# Patient Record
Sex: Male | Born: 2000 | Race: White | Hispanic: No | Marital: Single | Smoking: Never smoker
Health system: Southern US, Community
[De-identification: ages and names within clinical notes are randomized; demographics above are authoritative.]

## PROBLEM LIST (undated history)

## (undated) DIAGNOSIS — F840 Autistic disorder: Secondary | ICD-10-CM

## (undated) DIAGNOSIS — F909 Attention-deficit hyperactivity disorder, unspecified type: Secondary | ICD-10-CM

## (undated) DIAGNOSIS — F913 Oppositional defiant disorder: Secondary | ICD-10-CM

## (undated) DIAGNOSIS — F419 Anxiety disorder, unspecified: Secondary | ICD-10-CM

## (undated) HISTORY — DX: Autistic disorder: F84.0

## (undated) HISTORY — DX: Attention-deficit hyperactivity disorder, unspecified type: F90.9

## (undated) HISTORY — DX: Anxiety disorder, unspecified: F41.9

## (undated) HISTORY — PX: TOOTH EXTRACTION: SUR596

---
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
## 2022-01-12 ENCOUNTER — Telehealth (INDEPENDENT_AMBULATORY_CARE_PROVIDER_SITE_OTHER): Payer: Medicaid Other | Admitting: Psychiatry

## 2022-01-12 DIAGNOSIS — F840 Autistic disorder: Secondary | ICD-10-CM

## 2022-01-12 DIAGNOSIS — F902 Attention-deficit hyperactivity disorder, combined type: Secondary | ICD-10-CM | POA: Diagnosis not present

## 2022-01-12 MED ORDER — DIVALPROEX SODIUM ER 250 MG PO TB24
250.0000 mg | ORAL_TABLET | Freq: Every day | ORAL | 1 refills | Status: DC
Start: 2022-01-12 — End: 2022-07-19

## 2022-01-12 MED ORDER — FOCALIN XR 30 MG PO CP24: 30.0000 mg | ORAL_CAPSULE | Freq: Every day | ORAL | 0 refills | Status: DC

## 2022-01-12 MED ORDER — RISPERIDONE 0.5 MG PO TABS: ORAL_TABLET | ORAL | 2 refills | Status: DC

## 2022-01-12 MED ORDER — RISPERIDONE 1 MG PO TABS: ORAL_TABLET | ORAL | 2 refills | Status: DC

## 2022-01-12 NOTE — Progress Notes (Signed)
Virtual Visit via Video Note ? ?I connected with Isaiah Blakes on 01/12/22 at  3:00 PM EST by a video enabled telemedicine application and verified that I am speaking with the correct person using two identifiers. ? ?Location: ?Patient: home ?Provider: office ?  ?I discussed the limitations of evaluation and management by telemedicine and the availability of in person appointments. The patient expressed understanding and agreed to proceed. ? ?History of Present Illness:Met with [name] and mother for med f/u. He has remained on risperidone 42m TID, focalin XR 358mqam, depakote ER 25043mhs, guanfacine ER 4mg63m, and fluoxetine 20mg23m. He continues to do well at home and school and will be graduating from [employer]. He has some anxiety about leaving school but not excessive. His mood is good. He does not endorse depressive sxs. He is sleeping and eating well. ? ?  ?Observations/Objective:[name]; affect pleasant and appropriate. Speech normal rate, volume, rhythm.  Thought process logical and goal-directed.  Mood euthymic.  Thought content positive and congruent with mood.  Attention and concentration good.  ? ? ?Assessment and Plan:Discussed option of beginning some gradual taper of risperidone to determine continued benefit of this med or lowest effective dose and [name] iSuezanne Jacquetgreeable; we will decrease the lunch dose to 0.5mg a59mcontinue 1mg qa67mnd evening. Continue fluoxetine 20mg qa41muanfacine ER 4mg qd,a21mdepakote ER 250mg qhs 21mfocalin XR 30mg qam. 84mMay. ? ?Collaboration of Care: [employer] form for school to administer lower dose of risperidone at noon ? ?Patient/Guardian was advised Release of Information must be obtained prior to any record release in order to collaborate their care with an outside provider. Patient/Guardian was advised if they have not already done so to contact the registration department to sign all necessary forms in order  for us to [place] information regarding their care.  ? ?Consent: Patient/Guardian gives verbal consent for treatment and assignment of benefits for services provided during this visit. Patient/Guardian expressed understanding and agreed to proceed.  ? ? ?Follow Up Instructions: ? ?  ?I discussed the assessment and treatment plan with the patient. The patient was provided an opportunity to ask questions and all were answered. The patient agreed with the plan and demonstrated an understanding of the instructions. ?  ?The patient was advised to call back or seek an in-person evaluation if the symptoms worsen or if the condition fails to improve as anticipated. ? ?I provided 20 minutes of non-face-to-face time during this encounter. ? ? ?Webber Michiels,Raquel James

## 2022-02-05 ENCOUNTER — Other Ambulatory Visit (HOSPITAL_COMMUNITY): Payer: Self-pay | Admitting: Psychiatry

## 2022-02-16 ENCOUNTER — Other Ambulatory Visit (HOSPITAL_COMMUNITY): Payer: Self-pay | Admitting: Psychiatry

## 2022-02-16 ENCOUNTER — Telehealth (HOSPITAL_COMMUNITY): Payer: Self-pay

## 2022-02-16 MED ORDER — FOCALIN XR 30 MG PO CP24: 30.0000 mg | ORAL_CAPSULE | Freq: Every day | ORAL | 0 refills | Status: DC

## 2022-02-16 NOTE — Telephone Encounter (Signed)
sent 

## 2022-02-16 NOTE — Telephone Encounter (Signed)
Patient [facility] [address] [facility] [place] 

## 2022-03-17 ENCOUNTER — Telehealth (HOSPITAL_COMMUNITY): Payer: Self-pay | Admitting: Psychiatry

## 2022-03-17 NOTE — Telephone Encounter (Signed)
Per mom ?Needs refill on focalin  ?[facility] [address] ?

## 2022-03-20 ENCOUNTER — Other Ambulatory Visit (HOSPITAL_COMMUNITY): Payer: Self-pay | Admitting: Psychiatry

## 2022-03-20 MED ORDER — FOCALIN XR 30 MG PO CP24: 30.0000 mg | ORAL_CAPSULE | Freq: Every day | ORAL | 0 refills | Status: DC

## 2022-03-20 NOTE — Telephone Encounter (Signed)
sent 

## 2022-03-27 ENCOUNTER — Other Ambulatory Visit (HOSPITAL_COMMUNITY): Payer: Self-pay | Admitting: Psychiatry

## 2022-03-27 ENCOUNTER — Telehealth (INDEPENDENT_AMBULATORY_CARE_PROVIDER_SITE_OTHER): Payer: Medicaid Other | Admitting: Psychiatry

## 2022-03-27 DIAGNOSIS — F840 Autistic disorder: Secondary | ICD-10-CM | POA: Diagnosis not present

## 2022-03-27 DIAGNOSIS — F902 Attention-deficit hyperactivity disorder, combined type: Secondary | ICD-10-CM

## 2022-03-27 MED ORDER — RISPERIDONE 0.5 MG PO TABS: ORAL_TABLET | ORAL | 2 refills | Status: DC

## 2022-03-27 NOTE — Progress Notes (Signed)
Virtual Visit via Video Note  I connected with Isaiah Blakes on 03/27/22 at  3:30 PM EDT by a video enabled telemedicine application and verified that I am speaking with the correct person using two identifiers.  Location: Patient: home Provider: office   I discussed the limitations of evaluation and management by telemedicine and the availability of in person appointments. The patient expressed understanding and agreed to proceed.  History of Present Illness: Met with [name] and mother for med f/u. He is taking risperidone 43m qam and lunch and 0.530mqhs and has remained on focalin XR 3041mam, guanfacine ER 4mg55m, depakote ER 250mg59m, and fluoxetine 20mg 16m There has been no negative effect from decreasing the evening risperidone. His mood remains good, he is handling situations and changes without becoming upset or overwhelmed. His sleep and appetite are good. He will be doing a summer program to learn Public affairs consultantservations/Objective:[name]. Affect pleasant, full range. Speech normal rate, volume, rhythm.  Thought process logical and goal-directed.  Mood euthymic.  Thought content positive and congruent with mood.  Attention and concentration good.    Assessment and Plan:Continue gradual taper of risperidone, to 0.5mg BI47mnd 1mg at 93mch to determine any continued need or lowest effective dose. Conitnue focalin XR 30mg qam78manfacine ER 4mg qd, f57mxetine 20mg qam a56mepakote ER 250mg qhs. F46muly.   Follow Up Instructions:    I discussed the assessment and treatment plan with the patient. The patient was provided an opportunity to ask questions and all were answered. The patient agreed with the plan and demonstrated an understanding of the instructions.   The patient was advised to call back or seek an in-person evaluation if the symptoms worsen or if the condition fails to improve as anticipated.  I provided 20 minutes of non-face-to-face  time during this encounter.   Chistopher Mangino, Raquel James

## 2022-03-31 ENCOUNTER — Telehealth (HOSPITAL_COMMUNITY): Payer: Self-pay

## 2022-03-31 NOTE — Telephone Encounter (Signed)
Prior Authorization done for Risperidone 0.5mg  Approval# [id number] Expires 03/26/2023 Spoke with Revonda Standard at [facility] notified by fax

## 2022-04-04 NOTE — Telephone Encounter (Signed)
Prior Auth was done and approved by [employer]

## 2022-04-18 ENCOUNTER — Other Ambulatory Visit (HOSPITAL_COMMUNITY): Payer: Self-pay | Admitting: Psychiatry

## 2022-04-18 ENCOUNTER — Telehealth (HOSPITAL_COMMUNITY): Payer: Self-pay

## 2022-04-18 MED ORDER — FOCALIN XR 30 MG PO CP24: 30.0000 mg | ORAL_CAPSULE | Freq: Every day | ORAL | 0 refills | Status: DC

## 2022-04-18 NOTE — Telephone Encounter (Signed)
Patient [facility] a [facility] [address] in [place]

## 2022-04-18 NOTE — Telephone Encounter (Signed)
sent 

## 2022-04-28 ENCOUNTER — Other Ambulatory Visit (HOSPITAL_COMMUNITY): Payer: Self-pay | Admitting: Psychiatry

## 2022-04-29 ENCOUNTER — Other Ambulatory Visit (HOSPITAL_COMMUNITY): Payer: Self-pay | Admitting: Psychiatry

## 2022-05-11 ENCOUNTER — Other Ambulatory Visit (HOSPITAL_COMMUNITY): Payer: Self-pay | Admitting: Psychiatry

## 2022-05-11 ENCOUNTER — Telehealth (HOSPITAL_COMMUNITY): Payer: Self-pay

## 2022-05-11 NOTE — Telephone Encounter (Signed)
Prior Authorization done for Fluoxetine 20mg  tablets Approval# Expires 05/06/23 Spoke with 05/08/23 at [facility] Mom informed Pharmacy informed by fax

## 2022-05-11 NOTE — Telephone Encounter (Signed)
Pt mother called stating that the pharmacy is only willing to fill the capsules of prozac and the patient will not take them. She states that the pharmacy will need to speak with the provider to get the tablets filled. He is going out of town to [place] and will need them for his trip.

## 2022-05-16 ENCOUNTER — Telehealth (HOSPITAL_COMMUNITY): Payer: Medicaid Other | Admitting: Psychiatry

## 2022-05-18 ENCOUNTER — Telehealth (INDEPENDENT_AMBULATORY_CARE_PROVIDER_SITE_OTHER): Payer: Medicaid Other | Admitting: Psychiatry

## 2022-05-18 DIAGNOSIS — F840 Autistic disorder: Secondary | ICD-10-CM | POA: Diagnosis not present

## 2022-05-18 DIAGNOSIS — F902 Attention-deficit hyperactivity disorder, combined type: Secondary | ICD-10-CM

## 2022-05-18 MED ORDER — RISPERIDONE 0.5 MG PO TABS: ORAL_TABLET | ORAL | 1 refills | Status: DC

## 2022-05-18 MED ORDER — FOCALIN XR 30 MG PO CP24
30.0000 mg | ORAL_CAPSULE | Freq: Every day | ORAL | 0 refills | Status: DC
Start: 2022-05-18 — End: 2022-06-19

## 2022-05-18 NOTE — Progress Notes (Signed)
Virtual Visit via Video Note  I connected with Kurt Mclaughlin on 05/18/22 at  3:00 PM EDT by a video enabled telemedicine application and verified that I am speaking with the correct person using two identifiers.  Location: Patient: home Provider: office   I discussed the limitations of evaluation and management by telemedicine and the availability of in person appointments. The patient expressed understanding and agreed to proceed.  History of Present Illness:Met with Kurt Mclaughlin and mother for med f/u. He is taking risperidone 0.25m BID and 167mqlunch as well as focalin XR 3081mam, guanfacine ER 4mg17m, depakote ER 250mg39m, and fluoxetine 20mg 92m He has continued to do well with no negative effects from the very gradual tapering of risperidone. He recently had a trip to [place] and had a good time. There was one incident when he got very anxious and agitated (end of day, waiting for fireworks, street closed off) but otherwise he did fine. 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 Mood is good with no depressive sxs.    Observations/Objective:Neatly/casually dressed and groomed. Affect pleasant, appropriate. Speech normal rate, volume, rhythm.  Thought process logical and goal-directed.  Mood euthymic.  Thought content positive and congruent with mood.  Attention and concentration good.    Assessment and Plan:Continue slow taper of risperidone, to 0.5mg TI31mcontinue other meds as above. F/u Oct.  Collaboration of Care: Other none needed  Patient/Guardian was advised Release of Information must be obtained prior to any record release in order to collaborate their care with an outside provider. Patient/Guardian was advised if they have not already done so to contact the registration department to sign all necessary forms in order for us to  [place] information regarding their care.   Consent: Patient/Guardian gives verbal consent for treatment and assignment of benefits for services provided during this visit. Patient/Guardian expressed understanding and agreed to proceed.   Follow Up Instructions:    I discussed the assessment and treatment plan with the patient. The patient was provided an opportunity to ask questions and all were answered. The patient agreed with the plan and demonstrated an understanding of the instructions.   The patient was advised to call back or seek an in-person evaluation if the symptoms worsen or if the condition fails to improve as anticipated.  I provided 20 minutes of non-face-to-face time during this encounter.   Jarel Cuadra HooRaquel James

## 2022-06-19 ENCOUNTER — Other Ambulatory Visit (HOSPITAL_COMMUNITY): Payer: Self-pay | Admitting: Psychiatry

## 2022-06-19 ENCOUNTER — Telehealth (HOSPITAL_COMMUNITY): Payer: Self-pay

## 2022-06-19 MED ORDER — FOCALIN XR 30 MG PO CP24: 30.0000 mg | ORAL_CAPSULE | Freq: Every day | ORAL | 0 refills | Status: DC

## 2022-06-19 NOTE — Telephone Encounter (Signed)
Patient needs a [facility] [address] [facility] [place] Last refill 05/18/22 Next appt 08/16/22

## 2022-06-19 NOTE — Telephone Encounter (Signed)
sent 

## 2022-07-17 ENCOUNTER — Telehealth (HOSPITAL_COMMUNITY): Payer: Self-pay

## 2022-07-17 ENCOUNTER — Ambulatory Visit: Payer: Self-pay | Admitting: Family Medicine

## 2022-07-17 ENCOUNTER — Other Ambulatory Visit (HOSPITAL_COMMUNITY): Payer: Self-pay | Admitting: Psychiatry

## 2022-07-17 MED ORDER — FOCALIN XR 30 MG PO CP24: 30.0000 mg | ORAL_CAPSULE | Freq: Every day | ORAL | 0 refills | Status: DC

## 2022-07-17 NOTE — Telephone Encounter (Signed)
sent 

## 2022-07-17 NOTE — Telephone Encounter (Signed)
Patient needs a [facility] [address] Last refill 08/14 Next ov 10/11

## 2022-07-19 ENCOUNTER — Other Ambulatory Visit (HOSPITAL_COMMUNITY): Payer: Self-pay | Admitting: Psychiatry

## 2022-08-16 ENCOUNTER — Telehealth (INDEPENDENT_AMBULATORY_CARE_PROVIDER_SITE_OTHER): Payer: Medicaid Other | Admitting: Psychiatry

## 2022-08-16 DIAGNOSIS — F840 Autistic disorder: Secondary | ICD-10-CM | POA: Diagnosis not present

## 2022-08-16 DIAGNOSIS — F902 Attention-deficit hyperactivity disorder, combined type: Secondary | ICD-10-CM

## 2022-08-16 MED ORDER — FOCALIN XR 30 MG PO CP24
30.0000 mg | ORAL_CAPSULE | Freq: Every day | ORAL | 0 refills | Status: DC
Start: 2022-08-16 — End: 2022-09-18

## 2022-08-16 NOTE — Progress Notes (Signed)
Virtual Visit via Video Note  I connected with Ozias Ternes on 08/16/22 at  8:30 AM EDT by a video enabled telemedicine application and verified that I am speaking with the correct person using two identifiers.  Location: Patient: home Provider: office   I discussed the limitations of evaluation and management by telemedicine and the availability of in person appointments. The patient expressed understanding and agreed to proceed.  History of Present Illness:met with [name] and mother for med f/u. He has remained on focalin XR 30mg qam, guanfacine ER 4mg qd, depakote ER 250mg qhs, fluoxetine 20mg qam, and has decreased risperidone to 0.5mg TID with no negative effect. 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 He does not have much structure to his day and mother now working a new job and not home during day so he tends to stay in his room playing video games or watching screen. He has some difficulty maintaining a regular sleep schedule. Appetite is normal. He does not endorse any depressive sxs, no SI or thoughts of self harm. He has some anxiety about going to father's this weekend but this is typical and he does fine during visits.    Observations/Objective:[name]. Affect pleasant, full range. Speech normal rate, volume, rhythm.  Thought process logical and goal-directed.  Mood euthymic.  Thought content positive and congruent with mood.  Attention and concentration good.    Assessment and Plan:Discussed additional decrease in risperidone which [name] does agree to although he is anxious about any med changes. Recommend decreasing risperidone to 0.5mg BID (d/c lunch dose) and continue other meds focalin SR 30mg qam, guanfacine ER 4mg qd, depakote ER 250mg qhs, and fluoxetine 20mg qam with maintained improvement in attention, mood, and emotional  control. Discussed having some time outdoors each day or with some physical activity and staying out of his bed during the day to improve sleep at night. Began discussion of transfer of med management as provider will be leaving. F/u in December. Collaboration of Care: Other discussed transfer of med management  Patient/Guardian was advised Release of Information must be obtained prior to any record release in order to collaborate their care with an outside provider. Patient/Guardian was advised if they have not already done so to contact the registration department to sign all necessary forms in order for us to release information regarding their care.   Consent: Patient/Guardian gives verbal consent for treatment and assignment of benefits for services provided during this visit. Patient/Guardian expressed understanding and agreed to proceed.     Follow Up Instructions:    I discussed the assessment and treatment plan with the patient. The patient was provided an opportunity to ask questions and all were answered. The patient agreed with the plan and demonstrated an understanding of the instructions.   The patient was advised to call back or seek an in-person evaluation if the symptoms worsen or if the condition fails to improve as anticipated.  I provided 30 minutes of non-face-to-face time during this encounter.    , MD   

## 2022-09-18 ENCOUNTER — Other Ambulatory Visit (HOSPITAL_COMMUNITY): Payer: Self-pay | Admitting: Psychiatry

## 2022-09-18 ENCOUNTER — Telehealth (HOSPITAL_COMMUNITY): Payer: Self-pay

## 2022-09-18 MED ORDER — FOCALIN XR 30 MG PO CP24: 30.0000 mg | ORAL_CAPSULE | Freq: Every day | ORAL | 0 refills | Status: DC

## 2022-09-18 NOTE — Telephone Encounter (Signed)
sent 

## 2022-09-18 NOTE — Telephone Encounter (Signed)
Patient [facility] a [facility] [address] [facility] [place] Last refill 10/11 Next ov 12/21

## 2022-10-19 ENCOUNTER — Other Ambulatory Visit (HOSPITAL_COMMUNITY): Payer: Self-pay | Admitting: Psychiatry

## 2022-10-19 MED ORDER — FOCALIN XR 30 MG PO CP24
30.0000 mg | ORAL_CAPSULE | Freq: Every day | ORAL | 0 refills | Status: DC
Start: 2022-10-19 — End: 2022-10-26

## 2022-10-19 NOTE — Telephone Encounter (Signed)
sent 

## 2022-10-19 NOTE — Telephone Encounter (Signed)
Mom LVM last night  & again this AM.Requesting refill for her son ::FOCALIN XR 30 MG CP24   [id number] [place],  - [address]

## 2022-10-26 ENCOUNTER — Telehealth (INDEPENDENT_AMBULATORY_CARE_PROVIDER_SITE_OTHER): Payer: Medicaid Other | Admitting: Psychiatry

## 2022-10-26 DIAGNOSIS — F840 Autistic disorder: Secondary | ICD-10-CM | POA: Diagnosis not present

## 2022-10-26 DIAGNOSIS — F902 Attention-deficit hyperactivity disorder, combined type: Secondary | ICD-10-CM | POA: Diagnosis not present

## 2022-10-26 MED ORDER — FOCALIN XR 25 MG PO CP24: ORAL_CAPSULE | ORAL | 0 refills | Status: DC

## 2022-10-26 MED ORDER — FLUOXETINE HCL 20 MG PO TABS: 20.0000 mg | ORAL_TABLET | Freq: Every day | ORAL | 1 refills | Status: DC

## 2022-10-26 NOTE — Progress Notes (Signed)
Virtual Visit via Video Note  I connected with Kurt Mclaughlin on 10/26/22 at  8:30 AM EST by a video enabled telemedicine application and verified that I am speaking with the correct person using two identifiers.  Location: Patient: home Provider: office   I discussed the limitations of evaluation and management by telemedicine and the availability of in person appointments. The patient expressed understanding and agreed to proceed.  History of Present Illness:met with Kurt Mclaughlin and mother for med f/u. He has remained on focalin XR 7m qam, guanfacine ER 441mqd, depakote ER 25091mhs, fluoxetine 18m25mm, and has decreased risperidone to 0.5mg 51m (no longer takes lunch dose). There has been no negative effect from decrease of risperidone. His mood is good and his emotional control remains stable. His sleep and appetite are good, although he tends to stay up later since he is no longer in school. There is progress being made toward his participation in the virtual program for video game creation; he completed all the necessary paperwork for VR and mother has spoken to the director of the program so hopefully he will start soon (will be online 11am to 6pm).     Observations/Objective:[name]. Affect pleasant and appropriate. Speech normal rate, volume, rhythm.  Thought process logical and goal-directed.  Mood euthymic.  Thought content positive and congruent with mood.  Attention and concentration good.   Assessment and Plan: discussed some decrease in focalin XR; uncertain how much benefit he currently gets from this med, but he has told mother that he feels 'bad" if he doesn't take it at all. Kurt Mclaughlin anxious about making any med changes but agrees to try focalin XR 25mg 81m He will continue other meds: risperidone 0.5mg BI47mfluoxetine 18mg qa16muanfacine ER 4mg qd, 80m depakote ER 250mg qhs.58m Feb. Med management will transfer to Dr. Burgess afNelida Mclaughlin  appt. Collaboration of Care: Other discussed transfer of care  Patient/Guardian was advised Release of Information must be obtained prior to any record release in order to collaborate their care with an outside provider. Patient/Guardian was advised if they have not already done so to contact the registration department to sign all necessary forms in order for us to [place] information regarding their care.   Consent: Patient/Guardian gives verbal consent for treatment and assignment of benefits for services provided during this visit. Patient/Guardian expressed understanding and agreed to proceed.    Follow Up Instructions:    I discussed the assessment and treatment plan with the patient. The patient was provided an opportunity to ask questions and all were answered. The patient agreed with the plan and demonstrated an understanding of the instructions.   The patient was advised to call back or seek an in-person evaluation if the symptoms worsen or if the condition fails to improve as anticipated.  I provided 30 minutes of non-face-to-face time during this encounter.   Kurt Rebel HooverRaquel Mclaughlin

## 2022-11-30 ENCOUNTER — Other Ambulatory Visit (HOSPITAL_COMMUNITY): Payer: Self-pay | Admitting: Psychiatry

## 2022-11-30 ENCOUNTER — Telehealth (HOSPITAL_COMMUNITY): Payer: Self-pay

## 2022-11-30 MED ORDER — FOCALIN XR 25 MG PO CP24
ORAL_CAPSULE | ORAL | 0 refills | Status: DC
Start: 2022-11-30 — End: 2022-12-20

## 2022-11-30 NOTE — Telephone Encounter (Signed)
sent

## 2022-11-30 NOTE — Telephone Encounter (Signed)
Medication refill - Call from patient's Mother requesting a new Focalin XR 25 mg order for [facility] [address], last orderd 10/26/22 and patient returns next on 12/20/22.

## 2022-11-30 NOTE — Telephone Encounter (Signed)
Medication management - Telephone call, with patient's Mother, to inform Dr. Melanee Left had sent in their new Focalin XR [facility] mg [facility] [address] [facility] requested.

## 2022-12-05 ENCOUNTER — Other Ambulatory Visit (HOSPITAL_COMMUNITY): Payer: Self-pay | Admitting: Psychiatry

## 2022-12-20 ENCOUNTER — Telehealth (INDEPENDENT_AMBULATORY_CARE_PROVIDER_SITE_OTHER): Payer: Medicaid Other | Admitting: Psychiatry

## 2022-12-20 DIAGNOSIS — F840 Autistic disorder: Secondary | ICD-10-CM

## 2022-12-20 DIAGNOSIS — F902 Attention-deficit hyperactivity disorder, combined type: Secondary | ICD-10-CM

## 2022-12-20 MED ORDER — FOCALIN XR 25 MG PO CP24: ORAL_CAPSULE | ORAL | 0 refills | Status: DC

## 2022-12-20 NOTE — Progress Notes (Signed)
Virtual Visit via Video Note  I connected with Kurt Mclaughlin on 12/20/22 at 11:00 AM EST by a video enabled telemedicine application and verified that I am speaking with the correct person using two identifiers.  Location: Patient: home Provider: office   I discussed the limitations of evaluation and management by telemedicine and the availability of in person appointments. The patient expressed understanding and agreed to proceed.  History of Present Illness:Met with Kurt Mclaughlin and mother for med f/u. He has been taking slightly lower dose of focalin XR (89m) with no adverse effect and has remained on risperidone 0.571mBID, guanfacine ER 21m25md, depakote ER 250m721ms, and fluoxetine 20mg31m. He does endorse some increased anxiety, likely related to transitions as details for starting the virtual course in game creation are still being worked out with VR and knowing that this is our last appt and he will be changing providers for med management. His mood remains good. He is sleeping well at night and maintaining a schedule of getting up in the morning and staying busy at home.    Observations/Objective:[name]; good eye contact, engaged well. Affect pleasant and appropriate. Speech normal rate, volume, rhythm.  Thought process logical and goal-directed.  Mood euthymic.  Thought content positive and congruent with mood.  Attention and concentration good.   Assessment and Plan:Continue focalin XR 25mg 29m guanfacine ER 21mg qd82mepakote ER 250mg qh85misperidone 0.5mg BID,83md fluoxetine 20mg qam 6m maintained improvement in mood, anxiety, frustration tolerance, and attention. [name] does nSuezanne Jacquetwant to make any further decrease in any of his meds at this time as he will be seeing Dr. Burgess foNelida Mclaughlin med f/u. Some time spent on closure due to longstanding therapeutic relationship.  Collaboration of Care: Other transfer med management  Patient/Guardian was advised Release of  Information must be obtained prior to any record release in order to collaborate their care with an outside provider. Patient/Guardian was advised if they have not already done so to contact the registration department to sign all necessary forms in order for us to [place] information regarding their care.   Consent: Patient/Guardian gives verbal consent for treatment and assignment of benefits for services provided during this visit. Patient/Guardian expressed understanding and agreed to proceed.   Follow Up Instructions:    I discussed the assessment and treatment plan with the patient. The patient was provided an opportunity to ask questions and all were answered. The patient agreed with the plan and demonstrated an understanding of the instructions.   The patient was advised to call back or seek an in-person evaluation if the symptoms worsen or if the condition fails to improve as anticipated.  I provided 20 minutes of non-face-to-face time during this encounter.   Taleisha Kaczynski HooverRaquel James

## 2023-01-29 ENCOUNTER — Other Ambulatory Visit (HOSPITAL_COMMUNITY): Payer: Self-pay | Admitting: Psychiatry

## 2023-01-29 ENCOUNTER — Telehealth (HOSPITAL_COMMUNITY): Payer: Self-pay | Admitting: Psychiatry

## 2023-01-29 MED ORDER — FOCALIN XR 25 MG PO CP24: ORAL_CAPSULE | ORAL | 0 refills | Status: DC

## 2023-01-29 NOTE — Telephone Encounter (Signed)
sent 

## 2023-01-29 NOTE — Telephone Encounter (Signed)
Patient's mother left voicemail requesting refill of FOCALIN XR 25 MG CP24  sufficient to  bridge the gap to appointment on 02/01/2023 with Dr. Nelida Gores.Stated he took his last dose today.   [id number] [address], [place] Phone: [phone]  Fax: [phone]     Last ordered: 12/20/2022 - 30 capsules  Last visit: 12/20/2022  Next visit: 02/01/2023 with Dr. Nelida Gores

## 2023-01-30 NOTE — Telephone Encounter (Signed)
Medication management - Telephone call with patient's Mother to inform Dr. Melanee Left had sent in thier requested new [facility]ocalin XR [facility] [address] and collateral stated they had already picked it up.

## 2023-02-01 ENCOUNTER — Other Ambulatory Visit (HOSPITAL_COMMUNITY): Payer: Self-pay | Admitting: Psychiatry

## 2023-02-01 ENCOUNTER — Encounter (HOSPITAL_COMMUNITY): Payer: Self-pay | Admitting: Psychiatry

## 2023-02-01 ENCOUNTER — Ambulatory Visit (HOSPITAL_BASED_OUTPATIENT_CLINIC_OR_DEPARTMENT_OTHER): Payer: Medicaid Other | Admitting: Psychiatry

## 2023-02-01 DIAGNOSIS — F840 Autistic disorder: Secondary | ICD-10-CM

## 2023-02-01 DIAGNOSIS — F902 Attention-deficit hyperactivity disorder, combined type: Secondary | ICD-10-CM

## 2023-02-01 MED ORDER — FOCALIN XR 25 MG PO CP24: 25.0000 mg | ORAL_CAPSULE | Freq: Every day | ORAL | 0 refills | Status: DC

## 2023-02-01 MED ORDER — RISPERIDONE 0.5 MG PO TABS: 0.5000 mg | ORAL_TABLET | Freq: Two times a day (BID) | ORAL | 1 refills | Status: DC

## 2023-02-01 MED ORDER — FLUOXETINE HCL 20 MG PO TABS: 20.0000 mg | ORAL_TABLET | Freq: Every day | ORAL | 1 refills | Status: DC

## 2023-02-01 MED ORDER — GUANFACINE HCL ER 4 MG PO TB24: 4.0000 mg | ORAL_TABLET | Freq: Every day | ORAL | 1 refills | Status: DC

## 2023-02-01 MED ORDER — HYDROXYZINE HCL 10 MG PO TABS: 10.0000 mg | ORAL_TABLET | Freq: Three times a day (TID) | ORAL | 0 refills | Status: DC | PRN

## 2023-02-01 NOTE — Progress Notes (Signed)
Psychiatric Initial Adult Assessment   Patient Identification: Kurt Mclaughlin MRN:  [id number] Date of Evaluation:  02/01/2023 Referral Source: Dr. Melanee Left Chief Complaint:   Chief Complaint  Patient presents with   Establish Care   Visit Diagnosis:    ICD-10-CM   1. Autism spectrum disorder  F84.0     2. Attention deficit hyperactivity disorder (ADHD), combined type  F90.2        Assessment:  Kurt Mclaughlin is a 22 y.o. male with a history of Autism and ADHD who presents virtually to [place] [facility] for initial evaluation on 02/01/2023, following his transition from Dr. Melanee Left after her retirement.  Patient has a long history of autism and ADHD which has been well managed for the past several years since connecting with Dr. Melanee Left.  Prior to that patient had had an increase in mood lability, anxiety, and mood symptoms in ninth grade.  At initial visit patient reported some increased anxiety related to situational stressors.  Outside of this he notes that mood, anxiety, and attention symptoms have been well-controlled.  We will start the patient on Atarax 10 mg 3 times a day as needed for anxiety and continue on the remainder of his current regimen.  A number of assessments were performed during the evaluation today including PHQ-9 which they scored a 1 on, GAD-7 which they scored a 4 on, and [place] suicide severity screening which showed no risk.    Plan: - Continue Focalin XR 25 mg QD - Continue Risperidone 0.5 mg BID - Continue Guanfacine ER 4 mg QD - Continue Depakote ER 250 mg QHS - Continue Prozac 20 mg QD - Start Atarax 10 mg TID prn anxiety - Will look into therapy options for ADHD and autism - Crisis resources reviewed - Follow up in 6 weeks   History of Present Illness:  Kurt Mclaughlin presents for his appointment today alongside his mother.  He reports that things have been alright since his last appointment with Dr. Melanee Left.  His only concern  is that he might be getting a bit more anxious.  On exploration patient notes that switching providers caused a little bit anxiety but the primary reason for his concern is due to recent interaction with his father.  He describes his anxiety as racing thoughts, increased worry, and some irritability.  Patient notes that he stays with mom most the time but will stay with dad for a weekend every now and then.  In the past it used to be every other week but since Kurt Mclaughlin finished school it has decreased to every few weeks.  His last weekend there, Kurt Mclaughlin and his father had gotten into an argument where his father said that he should have never decreased his medications and should go back up.  Following this Kurt Mclaughlin has started to feel that he might need to go up on his anxiety medications.  Patient feels that the anxiety can start a day or 2 before he goes to dad's gradually building up until he gets there.  After he leaves it might take a day for her to return back to his baseline levels.  When asked what about going to dads triggers anxiety patient notes that his father can get him out of his comfort zone more.  He also describes his father as a bit more stubborn compared to his mother, which can result in been digging his heels in.  We discussed patient's symptoms and the relationship of the anxiety to situational stressors.  Based off the situational nature of this anxiety we suggested starting an as needed medication as opposed to increasing any of his other meds.  We discussed Atarax as a possible option and went over the risk and benefits.  Kurt Mclaughlin was open to giving it a try to see if that helped with his anxiety during the more stressful times.  Outside of this patient wants to remain on the remainder of his current medication regimen and denies any adverse side effects.  He is not currently involved in therapy though had been up until COVID.  They are open to considering therapy in the future.  Associated  Signs/Symptoms: Depression Symptoms:  anhedonia, (Hypo) Manic Symptoms:   Denies Anxiety Symptoms:  Excessive Worry, Psychotic Symptoms:   Denies PTSD Symptoms: NA  Past Psychiatric History:  Had outpatient med management at [facility] to 01-2016, followed by Dr. Darleene Cleaver, and then with Dr. Melanee Left. No prior psychiatric hospitalizations or prior suicide attempts.  Has been on Lexapro, Abilify, Dexedrine, Vyvanse, trazodone, propranolol, and clonidine in the past. Currently on a regimen of Focalin XR, guanfacine, Depakote, Risperdal, and Prozac.  Patient denies any substance use.  Previous Psychotropic Medications: Yes   Substance Abuse History in the last 12 months:  No.  Consequences of Substance Abuse: NA  Past Medical History:  Past Medical History:  Diagnosis Date   ADHD (attention deficit hyperactivity disorder)    Anxiety    Autism    Oppositional defiant disorder     Past Surgical History:  Procedure Laterality Date   CRANIOPLASTY Bilateral 02/05   TOOTH EXTRACTION  Age 67    Family Psychiatric History: Patient's mother has been on Lexapro in the past for anxiety  Family History:  Family History  Problem Relation Age of Onset   ADD / ADHD Neg Hx    Alcohol abuse Neg Hx    Anxiety disorder Neg Hx    Bipolar disorder Neg Hx    Dementia Neg Hx    Depression Neg Hx    Drug abuse Neg Hx    OCD Neg Hx    Paranoid behavior Neg Hx    Physical abuse Neg Hx    Schizophrenia Neg Hx    Seizures Neg Hx    Sexual abuse Neg Hx     Social History:   Social History   Socioeconomic History   Marital status: Single    Spouse name: Not on file   Number of children: Not on file   Years of education: Not on file   Highest education level: Not on file  Occupational History   Not on file  Tobacco Use   Smoking status: Never   Smokeless tobacco: Never  Vaping Use   Vaping Use: Never used  Substance and Sexual Activity   Alcohol use: No   Drug use: No   Sexual  activity: Never  Other Topics Concern   Not on file  Social History Narrative   Not on file   Social Determinants of Health   Financial Resource Strain: Not on file  Food Insecurity: Not on file  Transportation Needs: Not on file  Physical Activity: Not on file  Stress: Not on file  Social Connections: Not on file    Additional Social History: Patient lives with his mother and will spend weekends every few weeks with his dad.  He graduated school in May 2023, prior to which he would spend every other weekend with his dad.  Patient is in the process  of trying to get into a VR program in [place].  Allergies:  No Known Allergies  Metabolic Disorder Labs: Lab Results  Component Value Date   HGBA1C 5.2 12/25/2017   MPG 103 12/25/2017   MPG 120 (H) 04/29/2015   Lab Results  Component Value Date   PROLACTIN 15.3 (H) 12/25/2017   PROLACTIN <0.3 (L) 04/29/2015   Lab Results  Component Value Date   CHOL 186 (H) 12/25/2017   TRIG 117 (H) 12/25/2017   HDL 75 12/25/2017   CHOLHDL 2.5 12/25/2017   VLDL 12 04/29/2015   LDLCALC 90 12/25/2017   LDLCALC 93 04/29/2015   No results found for: "TSH"  Therapeutic Level Labs: No results found for: "LITHIUM" No results found for: "CBMZ" No results found for: "VALPROATE"  Current Medications: Current Outpatient Medications  Medication Sig Dispense Refill   CANNABIDIOL PO Take by mouth.     divalproex (DEPAKOTE ER) 250 MG 24 hr tablet TAKE 1 TABLET BY MOUTH EVERY DAY 90 tablet 1   FLUoxetine (PROZAC) 20 MG tablet Take 1 tablet (20 mg total) by mouth daily. 90 tablet 1   FOCALIN XR 25 MG CP24 Take one each morning after breakfast 30 capsule 0   guanFACINE (INTUNIV) 4 MG TB24 ER tablet TAKE 1 TABLET BY MOUTH EVERY DAY 90 tablet 1   risperiDONE (RISPERDAL) 0.5 MG tablet TAKE 1 TABLET THREE TIMES A DAY 270 tablet 1   No current facility-administered medications for this visit.    Psychiatric Specialty Exam: Review of Systems   There were no vitals taken for this visit.There is no height or weight on file to calculate BMI.  General Appearance: Fairly Groomed  Eye Contact:  Fair  Speech:  Clear and Coherent and Normal Rate  Volume:  Normal  Mood:  Anxious and Euthymic  Affect:  Appropriate  Thought Process:  Coherent  Orientation:  Full (Time, Place, and Person)  Thought Content:  Logical and Abstract Reasoning  Suicidal Thoughts:  No  Homicidal Thoughts:  No  Memory:  NA  Judgement:  Fair  Insight:  Fair  Psychomotor Activity:  Normal  Concentration:  Concentration: Good  Recall:  [place] of Knowledge:Fair  Language: Good  Akathisia:  No    AIMS (if indicated):  not done  Assets:  Museum/gallery curator Physical Health Social Support Vocational/Educational  ADL's:  Intact  Cognition: Impaired,  Mild  Sleep:  Good   Screenings: GAD-7    Flowsheet Row Office Visit from 12/03/2017 in [place] at [facility]  Total GAD-7 Score 9      PHQ2-9    Flowsheet Row Video Visit from 01/12/2021 in [place] at [facility] Visit from 12/03/2017 in [place] at [facility]  PHQ-2 Total Score 3 1  PHQ-9 Total Score 9 --      Flowsheet Row Video Visit from 01/12/2021 in [place] at [facility] No Risk        Collaboration of Care: Medication Management AEB medication prescription and Psychiatrist AEB chart review  Patient/Guardian was advised Release of Information must be obtained prior to any record release in order to collaborate their care with an outside provider. Patient/Guardian was advised if they have not already done so to contact the registration department to sign all necessary forms in order for [place] to release information regarding their care.   Consent:  Patient/Guardian  gives verbal consent for treatment and assignment of benefits for services provided during this visit. Patient/Guardian expressed understanding and agreed to proceed.   Vista Mink, MD 3/28/20242:00 PM    Virtual Visit via Video Note  I connected with Isaiah Blakes on 02/01/23 at  2:00 PM EDT by a video enabled telemedicine application and verified that I am speaking with the correct person using two identifiers.  Location: Patient: Home Provider: Home office   I discussed the limitations of evaluation and management by telemedicine and the availability of in person appointments. The patient expressed understanding and agreed to proceed.   I discussed the assessment and treatment plan with the patient. The patient was provided an opportunity to ask questions and all were answered. The patient agreed with the plan and demonstrated an understanding of the instructions.   The patient was advised to call back or seek an in-person evaluation if the symptoms worsen or if the condition fails to improve as anticipated.  I provided 60 minutes of non-face-to-face time during this encounter.   Vista Mink, MD

## 2023-02-05 NOTE — Addendum Note (Signed)
Addended by: Charlette Caffey on: 02/05/2023 12:39 PM   Modules accepted: Orders

## 2023-02-28 ENCOUNTER — Other Ambulatory Visit (HOSPITAL_COMMUNITY): Payer: Self-pay | Admitting: Psychiatry

## 2023-02-28 DIAGNOSIS — F840 Autistic disorder: Secondary | ICD-10-CM

## 2023-02-28 DIAGNOSIS — F902 Attention-deficit hyperactivity disorder, combined type: Secondary | ICD-10-CM

## 2023-03-15 ENCOUNTER — Encounter (HOSPITAL_COMMUNITY): Payer: Self-pay | Admitting: Psychiatry

## 2023-03-15 ENCOUNTER — Telehealth (HOSPITAL_BASED_OUTPATIENT_CLINIC_OR_DEPARTMENT_OTHER): Payer: Medicaid Other | Admitting: Psychiatry

## 2023-03-15 DIAGNOSIS — F902 Attention-deficit hyperactivity disorder, combined type: Secondary | ICD-10-CM

## 2023-03-15 DIAGNOSIS — F840 Autistic disorder: Secondary | ICD-10-CM | POA: Diagnosis not present

## 2023-03-15 MED ORDER — FOCALIN XR 25 MG PO CP24
25.0000 mg | ORAL_CAPSULE | Freq: Every day | ORAL | 0 refills | Status: DC
Start: 2023-03-15 — End: 2023-05-08

## 2023-03-15 MED ORDER — DEXMETHYLPHENIDATE HCL ER 25 MG PO CP24
25.0000 mg | ORAL_CAPSULE | Freq: Every day | ORAL | 0 refills | Status: DC
Start: 2023-04-20 — End: 2023-05-08

## 2023-03-15 MED ORDER — DIVALPROEX SODIUM ER 250 MG PO TB24: 250.0000 mg | ORAL_TABLET | Freq: Every day | ORAL | 1 refills | Status: DC

## 2023-03-15 NOTE — Progress Notes (Signed)
BH MD/PA/NP OP Progress Note  03/15/2023 11:02 AM Kurt Mclaughlin  MRN:  [id number]  Visit Diagnosis:    ICD-10-CM   1. Autism spectrum disorder  F84.0     2. Attention deficit hyperactivity disorder (ADHD), combined type  F90.2       Assessment: Kurt Mclaughlin is a 22 y.o. male with a history of Autism and ADHD who presents virtually to [facility] at [facility] for initial evaluation on 02/01/2023, following his transition from Dr. Milana Kidney after her retirement.  Patient has a long history of autism and ADHD which has been well managed for the past several years since connecting with Dr. Milana Kidney.  Prior to that patient had had an increase in mood lability, anxiety, and mood symptoms in ninth grade.  At initial visit patient reported some increased anxiety related to situational stressors.  Outside of this he notes that mood, anxiety, and attention symptoms have been well-controlled.    Kurt Mclaughlin presents for follow-up evaluation. Today, 03/15/23, patient reports that her mood and anxiety remains stable.  He has used the Atarax on a couple occasions with good benefit for his anxiety symptoms.  Patient did note a transient increase in appetite after taking the medication however felt that this is manageable.  Patient was provided with some other therapy referral options.  They also have an upcoming appointment with managed care where he might be assigned  peer support mentor.  Plan: - Continue Focalin XR 25 mg QD - Continue Risperidone 0.5 mg BID - Continue Guanfacine ER 4 mg QD - Continue Depakote ER 250 mg QHS - Continue Prozac 20 mg QD - Continue Atarax 10 mg TID prn anxiety - Will look into therapy options for ADHD and autism - Crisis resources reviewed - Follow up in 6 weeks   Chief Complaint:  Chief Complaint  Patient presents with   Follow-up   HPI: Kurt Mclaughlin presents alongside his mother.  He reports that the last 6 weeks have gone alright with no  significant concerns.  Patient feels like his mood has been fairly stable and anxiety has been well-controlled.  He notes that he has not gone to his dad's much in the interim due to various things coming up.  Kurt Mclaughlin did use the Atarax a couple times in terms and found that it had been helpful for his anxiety.  He did mention that it made him hungrier initially before that wore off.  We did review potential side effects of this medication.  Patient feels like the hunger side effects are manageable.  Patient denies any other concerns at this time.  He was updated on the power not taking their insurance and provided with other therapy options.  Patient's mother also noted that they are meeting with the manage care for next week to see if they will be assigned individual available, spending time with family during the days.  She mentioned that they also might go to the autism center to play some games.  Past Psychiatric History: Had outpatient med management at [facility] to 01-2016, followed by Dr. Jannifer Franklin, and then with Dr. Milana Kidney. No prior psychiatric hospitalizations or prior suicide attempts.  Has been on Lexapro, Abilify, Dexedrine, Vyvanse, trazodone, propranolol, and clonidine in the past. Currently on a regimen of Focalin XR, guanfacine, Depakote, Risperdal, and Prozac.  Past Medical History:  Past Medical History:  Diagnosis Date   ADHD (attention deficit hyperactivity disorder)    Anxiety    Autism    Oppositional defiant  disorder     Past Surgical History:  Procedure Laterality Date   CRANIOPLASTY Bilateral 02/05   TOOTH EXTRACTION  Age 61   Family History:  Family History  Problem Relation Age of Onset   ADD / ADHD Neg Hx    Alcohol abuse Neg Hx    Anxiety disorder Neg Hx    Bipolar disorder Neg Hx    Dementia Neg Hx    Depression Neg Hx    Drug abuse Neg Hx    OCD Neg Hx    Paranoid behavior Neg Hx    Physical abuse Neg Hx    Schizophrenia Neg Hx    Seizures Neg Hx     Sexual abuse Neg Hx     Social History:  Social History   Socioeconomic History   Marital status: Single    Spouse name: Not on file   Number of children: Not on file   Years of education: Not on file   Highest education level: Not on file  Occupational History   Not on file  Tobacco Use   Smoking status: Never   Smokeless tobacco: Never  Vaping Use   Vaping Use: Never used  Substance and Sexual Activity   Alcohol use: No   Drug use: No   Sexual activity: Never  Other Topics Concern   Not on file  Social History Narrative   Not on file   Social Determinants of Health   Financial Resource Strain: Not on file  Food Insecurity: Not on file  Transportation Needs: Not on file  Physical Activity: Not on file  Stress: Not on file  Social Connections: Not on file    Allergies: No Known Allergies  Current Medications: Current Outpatient Medications  Medication Sig Dispense Refill   CANNABIDIOL PO Take by mouth.     divalproex (DEPAKOTE ER) 250 MG 24 hr tablet TAKE 1 TABLET BY MOUTH EVERY DAY 90 tablet 1   FLUoxetine (PROZAC) 20 MG tablet Take 1 tablet (20 mg total) by mouth daily. 90 tablet 1   FOCALIN XR 25 MG CP24 Take 1 capsule (25 mg total) by mouth daily. Take one each morning after breakfast 30 capsule 0   guanFACINE (INTUNIV) 4 MG TB24 ER tablet Take 1 tablet (4 mg total) by mouth daily. 90 tablet 1   hydrOXYzine (ATARAX) 10 MG tablet Take 1 tablet (10 mg total) by mouth 3 (three) times daily as needed. 90 tablet 0   risperiDONE (RISPERDAL) 0.5 MG tablet Take 1 tablet (0.5 mg total) by mouth 2 (two) times daily. 180 tablet 1   No current facility-administered medications for this visit.     Psychiatric Specialty Exam: Review of Systems  There were no vitals taken for this visit.There is no height or weight on file to calculate BMI.  General Appearance: Fairly Groomed  Eye Contact:  Minimal  Speech:  Clear and Coherent and Normal Rate  Volume:  Normal  Mood:   Euthymic  Affect:  Congruent  Thought Process:  Coherent  Orientation:  Full (Time, Place, and Person)  Thought Content: Logical and concrete    Suicidal Thoughts:  No  Homicidal Thoughts:  No  Memory:  Immediate;   Fair  Judgement:  Fair  Insight:  Fair  Psychomotor Activity:  Normal  Concentration:  Concentration: Fair  Recall:  [employer] of Knowledge: Fair  Language: Good  Akathisia:  No    AIMS (if indicated): not done  Assets:  Architect  Leisure Time Physical Health  ADL's:  Intact  Cognition: WNL  Sleep:  Good   Metabolic Disorder Labs: Lab Results  Component Value Date   HGBA1C 5.2 12/25/2017   MPG 103 12/25/2017   MPG 120 (H) 04/29/2015   Lab Results  Component Value Date   PROLACTIN 15.3 (H) 12/25/2017   PROLACTIN <0.3 (L) 04/29/2015   Lab Results  Component Value Date   CHOL 186 (H) 12/25/2017   TRIG 117 (H) 12/25/2017   HDL 75 12/25/2017   CHOLHDL 2.5 12/25/2017   VLDL 12 04/29/2015   LDLCALC 90 12/25/2017   LDLCALC 93 04/29/2015   No results found for: "TSH"  Therapeutic Level Labs: No results found for: "LITHIUM" No results found for: "VALPROATE" No results found for: "CBMZ"   Screenings: GAD-7    Flowsheet Row [facility] Office Visit from 12/03/2017 in [facility] at [facility]  Total GAD-7 Score 4 9      PHQ2-9    Flowsheet Row Office Visit from 02/01/2023 in [facility] Video Visit from 01/12/2021 in [facility] at [facility] Office Visit from 12/03/2017 in [facility] at [facility]  PHQ-2 Total Score 1 3 1   PHQ-9 Total Score -- 9 --      Flowsheet Row Office Visit from 02/01/2023 in [facility] Video Visit from 01/12/2021 in  [facility] at [facility]  C-SSRS RISK CATEGORY No Risk No Risk       Collaboration of Care: Collaboration of Care: Medication Management AEB medication prescription  Patient/Guardian was advised Release of Information must be obtained prior to any record release in order to collaborate their care with an outside provider. Patient/Guardian was advised if they have not already done so to contact the registration department to sign all necessary forms in order for [place] to release information regarding their care.   Consent: Patient/Guardian gives verbal consent for treatment and assignment of benefits for services provided during this visit. Patient/Guardian expressed understanding and agreed to proceed.    Stasia Cavalier, MD 03/15/2023, 11:02 AM   Virtual Visit via Video Note  I connected with Kurt Mclaughlin on 03/15/23 at  2:00 PM EDT by a video enabled telemedicine application and verified that I am speaking with the correct person using two identifiers.  Location: Patient: Home Provider: Home Office   I discussed the limitations of evaluation and management by telemedicine and the availability of in person appointments. The patient expressed understanding and agreed to proceed.   I discussed the assessment and treatment plan with the patient. The patient was provided an opportunity to ask questions and all were answered. The patient agreed with the plan and demonstrated an understanding of the instructions.   The patient was advised to call back or seek an in-person evaluation if the symptoms worsen or if the condition fails to improve as anticipated.  I provided 15 minutes of non-face-to-face time during this encounter.   Stasia Cavalier, MD

## 2023-04-24 ENCOUNTER — Other Ambulatory Visit (HOSPITAL_COMMUNITY): Payer: Self-pay

## 2023-04-24 DIAGNOSIS — F840 Autistic disorder: Secondary | ICD-10-CM

## 2023-04-24 MED ORDER — FLUOXETINE HCL 20 MG PO TABS
20.0000 mg | ORAL_TABLET | Freq: Every day | ORAL | 0 refills | Status: DC
Start: 2023-04-24 — End: 2023-07-10

## 2023-04-24 NOTE — Telephone Encounter (Signed)
Patients mother called, she picked up a refill on the Prozac and it was capsules. Patient will only take tablets. I called the pharmacy and they said that the tablets are not approved just capsule. I called Medicaid and got the tablets approved.  Approval # [id number] Interaction ID : [id number] s/w Deja  I called the pharmacy back and mom is going to return the bottle she just picked up and get the tablets in return. I had to resend the prescription for the pharmacist to be able to fill.

## 2023-05-08 ENCOUNTER — Encounter (HOSPITAL_COMMUNITY): Payer: Self-pay | Admitting: Psychiatry

## 2023-05-08 ENCOUNTER — Telehealth (HOSPITAL_BASED_OUTPATIENT_CLINIC_OR_DEPARTMENT_OTHER): Payer: MEDICAID | Admitting: Psychiatry

## 2023-05-08 DIAGNOSIS — F840 Autistic disorder: Secondary | ICD-10-CM

## 2023-05-08 DIAGNOSIS — F902 Attention-deficit hyperactivity disorder, combined type: Secondary | ICD-10-CM

## 2023-05-08 MED ORDER — FOCALIN XR 25 MG PO CP24
25.0000 mg | ORAL_CAPSULE | Freq: Every day | ORAL | 0 refills | Status: DC
Start: 2023-05-21 — End: 2023-07-10

## 2023-05-08 MED ORDER — DEXMETHYLPHENIDATE HCL ER 25 MG PO CP24: 25.0000 mg | ORAL_CAPSULE | Freq: Every day | ORAL | 0 refills | Status: DC

## 2023-05-08 NOTE — Progress Notes (Signed)
BH MD/PA/NP OP Progress Note  05/08/2023 3:52 PM Kurt Mclaughlin  MRN:  [id number]  Visit Diagnosis:    ICD-10-CM   1. Autism spectrum disorder  F84.0     2. Attention deficit hyperactivity disorder (ADHD), combined type  F90.2       Assessment: Kurt Mclaughlin is a 22 y.o. male with a history of Autism and ADHD who presents virtually to [facility] at [facility] for initial evaluation on 02/01/2023, following his transition from Kurt Mclaughlin after her retirement.  Patient has a long history of autism and ADHD which has been well managed for the past several years since connecting with Kurt Mclaughlin.  Prior to that patient had had an increase in mood lability, anxiety, and mood symptoms in ninth grade.  At initial visit patient reported some increased anxiety related to situational stressors.  Outside of this he notes that mood, anxiety, and attention symptoms have been well-controlled.    Kurt Mclaughlin presents for follow-up evaluation. Today, 05/08/23, patient reports that things have remained stable in regards to his mood and anxiety over the past 6 weeks.  He has taken his medication consistently and denies any notable adverse side effects.  Patient cannot remember whether he has tried the Atarax again in the interim.  Of note patient was approved for 28 hours of individual manage care support and will be meeting with someone later this week.  From there they will be able to hire an individual support person.  We will continue on his current regimen and follow-up in 2 months.  Plan: - Continue Focalin XR 25 mg QD - Continue Risperidone 0.5 mg BID - Continue Guanfacine ER 4 mg QD - Continue Depakote ER 250 mg QHS - Continue Prozac 20 mg QD - Continue Atarax 10 mg TID prn anxiety - Will look into therapy options for ADHD and autism - Crisis resources reviewed - Follow up in 2 months   Chief Complaint:  Chief Complaint  Patient presents with   Follow-up   HPI:  Kurt Mclaughlin presents alongside his mother.  He reports that the past month has been good. He went on vacation to his grandfathers house in [place], and went to visit his aunt who was nearby which he ended up enjoying.  He was unable remember whether he took the hydroxyzine prior to going to his aunt's house or not but his mother thinks that he may have.  Kurt Mclaughlin is uncertain whether he has tried it at all since the initial experience as he has some concern on whether it suppressed or increased his appetite.  Outside of the trip to [place] patient notes that his paternal grandparents are visiting for his birthday.  His mother notes that he was also approved to have an individual through manage care and will be meeting with somebody in a few days.  Following the meeting they would look to hire somebody who can come up to 28 hours a week to help Kurt Mclaughlin learn and train in new skills.  His mother is unsure whether he will tolerate 28 hours a week however and may aim for around 15 hours a week.  Past Psychiatric History: Had outpatient med management at [facility] to 01-2016, followed by Kurt Mclaughlin, and then with Kurt Mclaughlin. No prior psychiatric hospitalizations or prior suicide attempts.  Has been on Lexapro, Abilify, Dexedrine, Vyvanse, trazodone, propranolol, and clonidine in the past. Currently on a regimen of Focalin XR, guanfacine, Depakote, Risperdal, and Prozac.  Past Medical History:  Past Medical History:  Diagnosis Date   ADHD (attention deficit hyperactivity disorder)    Anxiety    Autism    Oppositional defiant disorder     Past Surgical History:  Procedure Laterality Date   CRANIOPLASTY Bilateral 02/05   TOOTH EXTRACTION  Age 11   Family History:  Family History  Problem Relation Age of Onset   ADD / ADHD Neg Hx    Alcohol abuse Neg Hx    Anxiety disorder Neg Hx    Bipolar disorder Neg Hx    Dementia Neg Hx    Depression Neg Hx    Drug abuse Neg Hx    OCD Neg Hx    Paranoid  behavior Neg Hx    Physical abuse Neg Hx    Schizophrenia Neg Hx    Seizures Neg Hx    Sexual abuse Neg Hx     Social History:  Social History   Socioeconomic History   Marital status: Single    Spouse name: Not on file   Number of children: Not on file   Years of education: Not on file   Highest education level: Not on file  Occupational History   Not on file  Tobacco Use   Smoking status: Never   Smokeless tobacco: Never  Vaping Use   Vaping Use: Never used  Substance and Sexual Activity   Alcohol use: No   Drug use: No   Sexual activity: Never  Other Topics Concern   Not on file  Social History Narrative   Not on file   Social Determinants of Health   Financial Resource Strain: Not on file  Food Insecurity: Not on file  Transportation Needs: Not on file  Physical Activity: Not on file  Stress: Not on file  Social Connections: Not on file    Allergies: No Known Allergies  Current Medications: Current Outpatient Medications  Medication Sig Dispense Refill   CANNABIDIOL PO Take by mouth.     Dexmethylphenidate HCl (FOCALIN XR) 25 MG CP24 Take 1 capsule (25 mg total) by mouth daily. 30 capsule 0   divalproex (DEPAKOTE ER) 250 MG 24 hr tablet Take 1 tablet (250 mg total) by mouth daily. 90 tablet 1   FLUoxetine (PROZAC) 20 MG tablet Take 1 tablet (20 mg total) by mouth daily. 90 tablet 0   FOCALIN XR 25 MG CP24 Take 1 capsule (25 mg total) by mouth daily. Take one each morning after breakfast 30 capsule 0   guanFACINE (INTUNIV) 4 MG TB24 ER tablet Take 1 tablet (4 mg total) by mouth daily. 90 tablet 1   hydrOXYzine (ATARAX) 10 MG tablet Take 1 tablet (10 mg total) by mouth 3 (three) times daily as needed. 90 tablet 0   risperiDONE (RISPERDAL) 0.5 MG tablet Take 1 tablet (0.5 mg total) by mouth 2 (two) times daily. 180 tablet 1   No current facility-administered medications for this visit.     Psychiatric Specialty Exam: Review of Systems  There were no  vitals taken for this visit.There is no height or weight on file to calculate BMI.  General Appearance: Fairly Groomed  Eye Contact:  Minimal  Speech:  Clear and Coherent and Normal Rate  Volume:  Normal  Mood:  Euthymic  Affect:  Congruent  Thought Process:  Coherent  Orientation:  Full (Time, Place, and Person)  Thought Content: Logical and concrete    Suicidal Thoughts:  No  Homicidal Thoughts:  No  Memory:  Immediate;   Fair  Judgement:  Fair  Insight:  Fair  Psychomotor Activity:  Normal  Concentration:  Concentration: Fair  Recall:  Fair  Fund of Knowledge: Fair  Language: Good  Akathisia:  No    AIMS (if indicated): not done  Assets:  Architect Leisure Time Physical Health  ADL's:  Intact  Cognition: WNL  Sleep:  Good   Metabolic Disorder Labs: Lab Results  Component Value Date   HGBA1C 5.2 12/25/2017   MPG 103 12/25/2017   MPG 120 (H) 04/29/2015   Lab Results  Component Value Date   PROLACTIN 15.3 (H) 12/25/2017   PROLACTIN <0.3 (L) 04/29/2015   Lab Results  Component Value Date   CHOL 186 (H) 12/25/2017   TRIG 117 (H) 12/25/2017   HDL 75 12/25/2017   CHOLHDL 2.5 12/25/2017   VLDL 12 04/29/2015   LDLCALC 90 12/25/2017   LDLCALC 93 04/29/2015   No results found for: "TSH"  Therapeutic Level Labs: No results found for: "LITHIUM" No results found for: "VALPROATE" No results found for: "CBMZ"   Screenings: GAD-7    Flowsheet Row [facility] Office Visit from 12/03/2017 in [facility] at [facility]  Total GAD-7 Score 4 9      PHQ2-9    Flowsheet Row Office Visit from 02/01/2023 in [facility] Video Visit from 01/12/2021 in [facility] at [facility] Office Visit from 12/03/2017 in [facility]al  Health at [facility]  PHQ-2 Total Score 1 3 1   PHQ-9 Total Score -- 9 --      Flowsheet Row Office Visit from 02/01/2023 in [facility] Video Visit from 01/12/2021 in [facility] at [facility]  C-SSRS RISK CATEGORY No Risk No Risk       Collaboration of Care: Collaboration of Care: Medication Management AEB medication prescription  Patient/Guardian was advised Release of Information must be obtained prior to any record release in order to collaborate their care with an outside provider. Patient/Guardian was advised if they have not already done so to contact the registration department to sign all necessary forms in order for [place] to release information regarding their care.   Consent: Patient/Guardian gives verbal consent for treatment and assignment of benefits for services provided during this visit. Patient/Guardian expressed understanding and agreed to proceed.    Stasia Cavalier, MD 05/08/2023, 3:52 PM   Virtual Visit via Video Note  I connected with Kurt Mclaughlin on 05/08/23 at  3:30 PM EDT by a video enabled telemedicine application and verified that I am speaking with the correct person using two identifiers.  Location: Patient: Home Provider: Home Office   I discussed the limitations of evaluation and management by telemedicine and the availability of in person appointments. The patient expressed understanding and agreed to proceed.   I discussed the assessment and treatment plan with the patient. The patient was provided an opportunity to ask questions and all were answered. The patient agreed with the plan and demonstrated an understanding of the instructions.   The patient was advised to call back or seek an in-person evaluation if the symptoms worsen or if the condition fails to improve as anticipated.  I provided 15 minutes of non-face-to-face time during this  encounter.   Stasia Cavalier, MD

## 2023-07-10 ENCOUNTER — Telehealth (HOSPITAL_COMMUNITY): Payer: MEDICAID | Admitting: Psychiatry

## 2023-07-10 ENCOUNTER — Encounter (HOSPITAL_COMMUNITY): Payer: Self-pay | Admitting: Psychiatry

## 2023-07-10 DIAGNOSIS — F419 Anxiety disorder, unspecified: Secondary | ICD-10-CM | POA: Diagnosis not present

## 2023-07-10 DIAGNOSIS — F902 Attention-deficit hyperactivity disorder, combined type: Secondary | ICD-10-CM

## 2023-07-10 DIAGNOSIS — F840 Autistic disorder: Secondary | ICD-10-CM

## 2023-07-10 MED ORDER — GUANFACINE HCL ER 4 MG PO TB24: 4.0000 mg | ORAL_TABLET | Freq: Every day | ORAL | 1 refills | Status: DC

## 2023-07-10 MED ORDER — RISPERIDONE 0.5 MG PO TABS: 0.5000 mg | ORAL_TABLET | Freq: Two times a day (BID) | ORAL | 1 refills | Status: DC

## 2023-07-10 MED ORDER — DEXMETHYLPHENIDATE HCL ER 25 MG PO CP24: 25.0000 mg | ORAL_CAPSULE | Freq: Every day | ORAL | 0 refills | Status: DC

## 2023-07-10 MED ORDER — FLUOXETINE HCL 20 MG PO TABS: 20.0000 mg | ORAL_TABLET | Freq: Every day | ORAL | 0 refills | Status: DC

## 2023-07-10 MED ORDER — FOCALIN XR 25 MG PO CP24
25.0000 mg | ORAL_CAPSULE | Freq: Every day | ORAL | 0 refills | Status: DC
Start: 2023-07-27 — End: 2023-09-13

## 2023-07-10 NOTE — Progress Notes (Signed)
BH MD/PA/NP OP Progress Note  07/10/2023 3:20 PM Kurt Mclaughlin  MRN:  [id number]  Visit Diagnosis:    ICD-10-CM   1. Autism spectrum disorder  F84.0 FLUoxetine (PROZAC) 20 MG tablet    guanFACINE (INTUNIV) 4 MG TB24 ER tablet    risperiDONE (RISPERDAL) 0.5 MG tablet    2. Attention deficit hyperactivity disorder (ADHD), combined type  F90.2 Dexmethylphenidate HCl (FOCALIN XR) 25 MG CP24    guanFACINE (INTUNIV) 4 MG TB24 ER tablet    FOCALIN XR 25 MG CP24       Assessment: Kurt Mclaughlin is a 22 y.o. male with a history of Autism and ADHD who presents virtually to [facility] at [facility] for initial evaluation on 02/01/2023, following his transition from Dr. Milana Kidney after her retirement.  Patient has a long history of autism and ADHD which has been well managed for the past several years since connecting with Dr. Milana Kidney.  Prior to that patient had had an increase in mood lability, anxiety, and mood symptoms in ninth grade.  At initial visit patient reported some increased anxiety related to situational stressors.  Outside of this he notes that mood, anxiety, and attention symptoms have been well-controlled.    Gilson Workman presents for follow-up evaluation. Today, 07/10/23, patient reports mood has been stable over the past couple months.  Taken medication consistently without any adverse side effects and has been able to handle stressors better compared to the past.  He is still waiting on assignment for someone to work with him to help build skills.  Patient did enroll in a couple college classes in the interim and is handling it well.  We will continue current regimen and follow-up in 2 months.  Plan: - Continue Focalin XR 25 mg QD - Continue Risperidone 0.5 mg BID - Continue Guanfacine ER 4 mg QD - Continue Depakote ER 250 mg QHS - Continue Prozac 20 mg QD - Continue Atarax 10 mg TID prn anxiety - Will look into therapy options for ADHD and autism -  Crisis resources reviewed - Follow up in 2 months   Chief Complaint:  Chief Complaint  Patient presents with   Follow-up   HPI: Kurt Mclaughlin presents alongside his mother.  He reports that the past 2 months have gone fairly well for him.  He started taking a couple classes at an online college around 2 weeks ago and it has been going well so far.  This program seems to be carried a bit towards people who may have special needs and the curriculum is self-paced without online classes.  Patient has handled the stress of this and the stress of picking his classes without much incident.  He has also handled other stressors well for instance finding out about this appointment earlier this morning.  Typically that would cause extreme anxiety however today he did get anxious but was able to get over it quickly.  With the limited anxiety patient has not needed the hydroxyzine and forgot he had it.  He has continued to take the rest of his medications as scheduled and denies any adverse side effects.  Patient is still waiting on the manage care facility to find somebody to hire who will teach him skills.  He is approved for 28 hours a week that might be less now especially since he has started taking a couple classes.  Past Psychiatric History: Had outpatient med management at [facility] to 01-2016, followed by Dr. Jannifer Franklin, and then with Dr. Milana Kidney.  No prior psychiatric hospitalizations or prior suicide attempts.  Has been on Lexapro, Abilify, Dexedrine, Vyvanse, trazodone, propranolol, and clonidine in the past. Currently on a regimen of Focalin XR, guanfacine, Depakote, Risperdal, and Prozac.  Past Medical History:  Past Medical History:  Diagnosis Date   ADHD (attention deficit hyperactivity disorder)    Anxiety    Autism    Oppositional defiant disorder     Past Surgical History:  Procedure Laterality Date   CRANIOPLASTY Bilateral 02/05   TOOTH EXTRACTION  Age 65   Family History:  Family History   Problem Relation Age of Onset   ADD / ADHD Neg Hx    Alcohol abuse Neg Hx    Anxiety disorder Neg Hx    Bipolar disorder Neg Hx    Dementia Neg Hx    Depression Neg Hx    Drug abuse Neg Hx    OCD Neg Hx    Paranoid behavior Neg Hx    Physical abuse Neg Hx    Schizophrenia Neg Hx    Seizures Neg Hx    Sexual abuse Neg Hx     Social History:  Social History   Socioeconomic History   Marital status: Single    Spouse name: Not on file   Number of children: Not on file   Years of education: Not on file   Highest education level: Not on file  Occupational History   Not on file  Tobacco Use   Smoking status: Never   Smokeless tobacco: Never  Vaping Use   Vaping status: Never Used  Substance and Sexual Activity   Alcohol use: No   Drug use: No   Sexual activity: Never  Other Topics Concern   Not on file  Social History Narrative   Not on file   Social Determinants of Health   Financial Resource Strain: Not on file  Food Insecurity: Not on file  Transportation Needs: Not on file  Physical Activity: Not on file  Stress: Not on file  Social Connections: Not on file    Allergies: No Known Allergies  Current Medications: Current Outpatient Medications  Medication Sig Dispense Refill   CANNABIDIOL PO Take by mouth.     [START ON 08/24/2023] Dexmethylphenidate HCl (FOCALIN XR) 25 MG CP24 Take 1 capsule (25 mg total) by mouth daily. 30 capsule 0   divalproex (DEPAKOTE ER) 250 MG 24 hr tablet Take 1 tablet (250 mg total) by mouth daily. 90 tablet 1   FLUoxetine (PROZAC) 20 MG tablet Take 1 tablet (20 mg total) by mouth daily. 90 tablet 0   [START ON 07/27/2023] FOCALIN XR 25 MG CP24 Take 1 capsule (25 mg total) by mouth daily. Take one each morning after breakfast 30 capsule 0   guanFACINE (INTUNIV) 4 MG TB24 ER tablet Take 1 tablet (4 mg total) by mouth daily. 90 tablet 1   hydrOXYzine (ATARAX) 10 MG tablet Take 1 tablet (10 mg total) by mouth 3 (three) times daily as  needed. 90 tablet 0   risperiDONE (RISPERDAL) 0.5 MG tablet Take 1 tablet (0.5 mg total) by mouth 2 (two) times daily. 180 tablet 1   No current facility-administered medications for this visit.     Psychiatric Specialty Exam: Review of Systems  There were no vitals taken for this visit.There is no height or weight on file to calculate BMI.  General Appearance: Fairly Groomed  Eye Contact:  Minimal  Speech:  Clear and Coherent and Normal Rate  Volume:  Normal  Mood:  Euthymic  Affect:  Congruent  Thought Process:  Coherent  Orientation:  Full (Time, Place, and Person)  Thought Content: Logical and concrete    Suicidal Thoughts:  No  Homicidal Thoughts:  No  Memory:  Immediate;   Fair  Judgement:  Fair  Insight:  Fair  Psychomotor Activity:  Normal  Concentration:  Concentration: Fair  Recall:  Fair  Fund of Knowledge: Fair  Language: Good  Akathisia:  No    AIMS (if indicated): not done  Assets:  Architect Leisure Time Physical Health  ADL's:  Intact  Cognition: WNL  Sleep:  Good   Metabolic Disorder Labs: Lab Results  Component Value Date   HGBA1C 5.2 12/25/2017   MPG 103 12/25/2017   MPG 120 (H) 04/29/2015   Lab Results  Component Value Date   PROLACTIN 15.3 (H) 12/25/2017   PROLACTIN <0.3 (L) 04/29/2015   Lab Results  Component Value Date   CHOL 186 (H) 12/25/2017   TRIG 117 (H) 12/25/2017   HDL 75 12/25/2017   CHOLHDL 2.5 12/25/2017   VLDL 12 04/29/2015   LDLCALC 90 12/25/2017   LDLCALC 93 04/29/2015   No results found for: "TSH"  Therapeutic Level Labs: No results found for: "LITHIUM" No results found for: "VALPROATE" No results found for: "CBMZ"   Screenings: GAD-7    Flowsheet Row Office Visit from 02/01/2023 [facility] Office Visit from 12/03/2017 in [facility] at [facility]  Total GAD-7 Score 4 9      PHQ2-9     Flowsheet Row Office Visit from 02/01/2023 in [facility] Video Visit from 01/12/2021 in [facility] Outpatient Behavioral [facility] at [facility] Office Visit from 12/03/2017 in [facility] at [facility]  PHQ-2 Total Score 1 3 1   PHQ-9 Total Score -- 9 --      Flowsheet Row Office Visit from 02/01/2023 in [facility] Video Visit from 01/12/2021 in [facility] Outpatient Behavioral Health at [facility]  C-SSRS RISK CATEGORY No Risk No Risk       Collaboration of Care: Collaboration of Care: Medication Management AEB medication prescription  Patient/Guardian was advised Release of Information must be obtained prior to any record release in order to collaborate their care with an outside provider. Patient/Guardian was advised if they have not already done so to contact the registration department to sign all necessary forms in order for [place] to release information regarding their care.   Consent: Patient/Guardian gives verbal consent for treatment and assignment of benefits for services provided during this visit. Patient/Guardian expressed understanding and agreed to proceed.    Stasia Cavalier, MD 07/10/2023, 3:20 PM   Virtual Visit via Video Note  I connected with Brunetta Genera on 07/10/23 at  3:00 PM EDT by a video enabled telemedicine application and verified that I am speaking with the correct person using two identifiers.  Location: Patient: Home Provider: Home Office   I discussed the limitations of evaluation and management by telemedicine and the availability of in person appointments. The patient expressed understanding and agreed to proceed.   I discussed the assessment and treatment plan with the patient. The patient was provided an opportunity to ask questions and all were answered. The patient agreed with the plan and demonstrated an  understanding of the instructions.   The patient was advised to call back or seek an in-person evaluation if  the symptoms worsen or if the condition fails to improve as anticipated.  I provided 15 minutes of non-face-to-face time during this encounter.   Stasia Cavalier, MD

## 2023-09-10 NOTE — Progress Notes (Signed)
BH MD/PA/NP OP Progress Note  09/14/2023 4:29 PM Thelonious Oglesbee  MRN:  [id number]  Visit Diagnosis:    ICD-10-CM   1. Autism spectrum disorder  F84.0 divalproex (DEPAKOTE ER) 250 MG 24 hr tablet    FLUoxetine (PROZAC) 20 MG tablet    2. Attention deficit hyperactivity disorder (ADHD), combined type  F90.2 FOCALIN XR 25 MG CP24    Dexmethylphenidate HCl (FOCALIN XR) 25 MG CP24       Assessment: Yun Conn is a 22 y.o. male with a history of Autism and ADHD who presents virtually to [facility] for initial evaluation on 02/01/2023, following his transition from Dr. Milana Kidney after her retirement.  Patient has a long history of autism and ADHD which has been well managed for the past several years since connecting with Dr. Milana Kidney.  Prior to that patient had had an increase in mood lability, anxiety, and mood symptoms in ninth grade.  At initial visit patient reported some increased anxiety related to situational stressors.  Outside of this he notes that mood, anxiety, and attention symptoms have been well-controlled.    [place] presents for follow-up evaluation. Today, 09/10/23, patient reports feeling stable however appears to be minimizing some increased anxiety.  This is secondary to an upcoming move which he has been avoiding thoughts of to the best of his ability.  It is likely that the change in routine and structure will be difficult for this patient.  He has not engaged in any dangerous behaviors or self-harm.  Instead he has just been a little bit more frustrated recently.  Encouraged him to use the as needed hydroxyzine during this time.  Patient also has a Microbiologist who he is seeing once a week which has been beneficial.  Will continue on current regimen and follow up in 2 months.  Plan: - Continue Focalin XR 25 mg QD - Continue Risperidone 0.5 mg BID - Continue Guanfacine ER 4 mg QD - Continue Depakote ER 250 mg QHS -  Continue Prozac 20 mg QD - Continue Atarax 10 mg TID prn anxiety - Will look into therapy options for ADHD and autism - Crisis resources reviewed - Follow up in 2 months   Chief Complaint:  Chief Complaint  Patient presents with   Follow-up   HPI: Marvins presents alongside his mother.  He reports that that things are going all right.  He is getting ready to move to a new house in [place] with his mother and a couple weeks.  He has also been doing well in classes completing his first term and is just started his second.  Lenyx notes that he has been more stressed out lately and became frustrated when asked about the move or his schoolwork.  He states that he feels like he is being constantly harassed with people asking about it.  Of note he has not yet chosen to see the house they are moving to.  It is suspected that the changes associated with the move is going to be difficult for him.  Other than that patient has started with support worker, Sam, who has been meeting with them once a week.  There is a plan to move to twice a week in the near future.  The 2 have played video games and gone to a few stores together.  Past Psychiatric History: Had outpatient med management at [facility] to 01-2016, followed by Dr. Jannifer Franklin, and then with Dr. Milana Kidney. No prior psychiatric  hospitalizations or prior suicide attempts.  Has been on Lexapro, Abilify, Dexedrine, Vyvanse, trazodone, propranolol, and clonidine in the past. Currently on a regimen of Focalin XR, guanfacine, Depakote, Risperdal, and Prozac.  Past Medical History:  Past Medical History:  Diagnosis Date   ADHD (attention deficit hyperactivity disorder)    Anxiety    Autism    Oppositional defiant disorder     Past Surgical History:  Procedure Laterality Date   CRANIOPLASTY Bilateral 02/05   TOOTH EXTRACTION  Age 76   Family History:  Family History  Problem Relation Age of Onset   ADD / ADHD Neg Hx    Alcohol abuse Neg Hx     Anxiety disorder Neg Hx    Bipolar disorder Neg Hx    Dementia Neg Hx    Depression Neg Hx    Drug abuse Neg Hx    OCD Neg Hx    Paranoid behavior Neg Hx    Physical abuse Neg Hx    Schizophrenia Neg Hx    Seizures Neg Hx    Sexual abuse Neg Hx     Social History:  Social History   Socioeconomic History   Marital status: Single    Spouse name: Not on file   Number of children: Not on file   Years of education: Not on file   Highest education level: Not on file  Occupational History   Not on file  Tobacco Use   Smoking status: Never   Smokeless tobacco: Never  Vaping Use   Vaping status: Never Used  Substance and Sexual Activity   Alcohol use: No   Drug use: No   Sexual activity: Never  Other Topics Concern   Not on file  Social History Narrative   Not on file   Social Determinants of Health   Financial Resource Strain: Not on file  Food Insecurity: Not on file  Transportation Needs: Not on file  Physical Activity: Not on file  Stress: Not on file  Social Connections: Not on file    Allergies: No Known Allergies  Current Medications: Current Outpatient Medications  Medication Sig Dispense Refill   CANNABIDIOL PO Take by mouth.     Dexmethylphenidate HCl (FOCALIN XR) 25 MG CP24 Take 1 capsule (25 mg total) by mouth daily. 30 capsule 0   divalproex (DEPAKOTE ER) 250 MG 24 hr tablet Take 1 tablet (250 mg total) by mouth daily. 90 tablet 1   FLUoxetine (PROZAC) 20 MG tablet Take 1 tablet (20 mg total) by mouth daily. 90 tablet 0   FOCALIN XR 25 MG CP24 Take 1 capsule (25 mg total) by mouth daily. Take one each morning after breakfast 30 capsule 0   guanFACINE (INTUNIV) 4 MG TB24 ER tablet Take 1 tablet (4 mg total) by mouth daily. 90 tablet 1   hydrOXYzine (ATARAX) 10 MG tablet Take 1 tablet (10 mg total) by mouth 3 (three) times daily as needed. 90 tablet 0   risperiDONE (RISPERDAL) 0.5 MG tablet Take 1 tablet (0.5 mg total) by mouth 2 (two) times daily. 180  tablet 1   No current facility-administered medications for this visit.     Psychiatric Specialty Exam: Review of Systems  There were no vitals taken for this visit.There is no height or weight on file to calculate BMI.  General Appearance: Fairly Groomed  Eye Contact:  Minimal  Speech:  Clear and Coherent and Normal Rate  Volume:  Normal  Mood:  Anxious, Euthymic, and Irritable  Affect:  Congruent  Thought Process:  Coherent  Orientation:  Full (Time, Place, and Person)  Thought Content: Logical and concrete    Suicidal Thoughts:  No  Homicidal Thoughts:  No  Memory:  Immediate;   Fair  Judgement:  Fair  Insight:  Fair  Psychomotor Activity:  Normal  Concentration:  Concentration: Fair  Recall:  Fair  Fund of Knowledge: Fair  Language: Good  Akathisia:  No    AIMS (if indicated): not done  Assets:  Architect Leisure Time Physical Health  ADL's:  Intact  Cognition: WNL  Sleep:  Good   Metabolic Disorder Labs: Lab Results  Component Value Date   HGBA1C 5.2 12/25/2017   MPG 103 12/25/2017   MPG 120 (H) 04/29/2015   Lab Results  Component Value Date   PROLACTIN 15.3 (H) 12/25/2017   PROLACTIN <0.3 (L) 04/29/2015   Lab Results  Component Value Date   CHOL 186 (H) 12/25/2017   TRIG 117 (H) 12/25/2017   HDL 75 12/25/2017   CHOLHDL 2.5 12/25/2017   VLDL 12 04/29/2015   LDLCALC 90 12/25/2017   LDLCALC 93 04/29/2015   No results found for: "TSH"  Therapeutic Level Labs: No results found for: "LITHIUM" No results found for: "VALPROATE" No results found for: "CBMZ"   Screenings: GAD-7    Flowsheet Row [facility] Office Visit from 12/03/2017 in [facility] at [facility]  Total GAD-7 Score 4 9      PHQ2-9    Flowsheet Row Office Visit [facility] 02/01/2023 [facility] Video Visit from 01/12/2021 in [facility] at [facility] Office Visit from 12/03/2017 in [facility] at [facility]  PHQ-2 Total Score 1 3 1   PHQ-9 Total Score -- 9 --      Flowsheet Row Office Visit from 02/01/2023 in [facility] Video Visit from 01/12/2021 in [facility] at [facility]  C-SSRS RISK CATEGORY No Risk No Risk       Collaboration of Care: Collaboration of Care: Medication Management AEB medication prescription  Patient/Guardian was advised Release of Information must be obtained prior to any record release in order to collaborate their care with an outside provider. Patient/Guardian was advised if they have not already done so to contact the registration department to sign all necessary forms in order for [place] to release information regarding their care.   Consent: Patient/Guardian gives verbal consent for treatment and assignment of benefits for services provided during this visit. Patient/Guardian expressed understanding and agreed to proceed.    Stasia Cavalier, MD 09/10/2023, 4:29 PM   Virtual Visit via Video Note  I connected with Brunetta Genera on 09/14/23 at  3:00 PM EST by a video enabled telemedicine application and verified that I am speaking with the correct person using two identifiers.  Location: Patient: Home Provider: Home Office   I discussed the limitations of evaluation and management by telemedicine and the availability of in person appointments. The patient expressed understanding and agreed to proceed.   I discussed the assessment and treatment plan with the patient. The patient was provided an opportunity to ask questions and all were answered. The patient agreed with the plan and demonstrated an understanding of the instructions.   The patient was advised to call back or seek an  in-person evaluation if the symptoms worsen or if the  condition fails to improve as anticipated.  I provided 15 minutes of non-face-to-face time during this encounter.   Stasia Cavalier, MD

## 2023-09-13 ENCOUNTER — Telehealth (HOSPITAL_COMMUNITY): Payer: MEDICAID | Admitting: Psychiatry

## 2023-09-13 DIAGNOSIS — F902 Attention-deficit hyperactivity disorder, combined type: Secondary | ICD-10-CM

## 2023-09-13 DIAGNOSIS — F840 Autistic disorder: Secondary | ICD-10-CM | POA: Diagnosis not present

## 2023-09-13 MED ORDER — FOCALIN XR 25 MG PO CP24: 25.0000 mg | ORAL_CAPSULE | Freq: Every day | ORAL | 0 refills | Status: DC

## 2023-09-13 MED ORDER — DIVALPROEX SODIUM ER 250 MG PO TB24: 250.0000 mg | ORAL_TABLET | Freq: Every day | ORAL | 1 refills | Status: DC

## 2023-09-13 MED ORDER — FLUOXETINE HCL 20 MG PO TABS: 20.0000 mg | ORAL_TABLET | Freq: Every day | ORAL | 0 refills | Status: DC

## 2023-09-13 MED ORDER — DEXMETHYLPHENIDATE HCL ER 25 MG PO CP24: 25.0000 mg | ORAL_CAPSULE | Freq: Every day | ORAL | 0 refills | Status: DC

## 2023-09-14 ENCOUNTER — Encounter (HOSPITAL_COMMUNITY): Payer: Self-pay | Admitting: Psychiatry

## 2023-09-25 ENCOUNTER — Encounter: Payer: Self-pay | Admitting: Family Medicine

## 2023-09-25 ENCOUNTER — Ambulatory Visit (INDEPENDENT_AMBULATORY_CARE_PROVIDER_SITE_OTHER): Payer: MEDICAID | Admitting: Family Medicine

## 2023-09-25 VITALS — BP 112/70 | HR 105 | Temp 98.9°F | Ht 69.5 in | Wt 127.9 lb

## 2023-09-25 DIAGNOSIS — G44229 Chronic tension-type headache, not intractable: Secondary | ICD-10-CM | POA: Diagnosis not present

## 2023-09-25 DIAGNOSIS — R636 Underweight: Secondary | ICD-10-CM | POA: Diagnosis not present

## 2023-09-25 DIAGNOSIS — Z23 Encounter for immunization: Secondary | ICD-10-CM

## 2023-09-25 DIAGNOSIS — E559 Vitamin D deficiency, unspecified: Secondary | ICD-10-CM | POA: Diagnosis not present

## 2023-09-25 DIAGNOSIS — D508 Other iron deficiency anemias: Secondary | ICD-10-CM

## 2023-09-25 LAB — CBC WITH DIFFERENTIAL/PLATELET
Basophils Absolute: 0 10*3/uL (ref 0.0–0.1)
Basophils Relative: 0.7 % (ref 0.0–3.0)
Eosinophils Absolute: 0 10*3/uL (ref 0.0–0.7)
Eosinophils Relative: 0.3 % (ref 0.0–5.0)
HCT: 32.7 % — ABNORMAL LOW (ref 39.0–52.0)
Hemoglobin: 9.9 g/dL — ABNORMAL LOW (ref 13.0–17.0)
Lymphocytes Relative: 28.3 % (ref 12.0–46.0)
Lymphs Abs: 1.8 10*3/uL (ref 0.7–4.0)
MCHC: 30.4 g/dL (ref 30.0–36.0)
MCV: 68.2 fL — ABNORMAL LOW (ref 78.0–100.0)
Monocytes Absolute: 0.4 10*3/uL (ref 0.1–1.0)
Monocytes Relative: 5.8 % (ref 3.0–12.0)
Neutro Abs: 4.2 10*3/uL (ref 1.4–7.7)
Neutrophils Relative %: 64.9 % (ref 43.0–77.0)
Platelets: 468 10*3/uL — ABNORMAL HIGH (ref 150.0–400.0)
RBC: 4.79 Mil/uL (ref 4.22–5.81)
RDW: 17.2 % — ABNORMAL HIGH (ref 11.5–14.6)
WBC: 6.4 10*3/uL (ref 4.5–10.5)

## 2023-09-25 LAB — VITAMIN B12: Vitamin B-12: 515 pg/mL (ref 211–911)

## 2023-09-25 LAB — TSH: TSH: 0.72 u[IU]/mL (ref 0.35–5.50)

## 2023-09-25 LAB — VITAMIN D 25 HYDROXY (VIT D DEFICIENCY, FRACTURES): VITD: <7 ng/mL — ABNORMAL LOW (ref 30.00–100.00)

## 2023-09-25 MED ORDER — VITAMIN D (ERGOCALCIFEROL) 1.25 MG (50000 UNIT) PO CAPS: 50000.0000 [IU] | ORAL_CAPSULE | ORAL | 1 refills | Status: DC

## 2023-09-25 MED ORDER — IRON (FERROUS SULFATE) 325 (65 FE) MG PO TABS: 325.0000 mg | ORAL_TABLET | Freq: Every day | ORAL | 1 refills | Status: DC

## 2023-09-25 NOTE — Addendum Note (Signed)
Addended by: Karie Georges on: 09/25/2023 04:04 PM   Modules accepted: Orders

## 2023-09-25 NOTE — Addendum Note (Signed)
Addended by: Johnella Moloney on: 09/25/2023 01:32 PM   Modules accepted: Orders

## 2023-09-25 NOTE — Progress Notes (Signed)
New Patient Office Visit  Subjective    Patient ID: Kurt Mclaughlin, male    DOB: [birth year]  Age: 22 y.o. MRN: [id number]  CC:  Chief Complaint  Patient presents with   Establish Care    HPI Alexys Orris presents to establish care Today is present with mom today. Used to see Dr. Earlene Plater for his pediatric care. Patient is reporting headaches, almost on a daily basis according to his mom. He describes it as happening mostly in the mornings, across the front of his head. No associated symptoms with the headaches, no aura, no nausea, no blurry vision or slurred speech. He reports that he does get occasional migraines but he reports these are rare. Occasionally happens with the weather (storms). Usually takes tylenol in the mornings which does help.   Mom is concerned about his weight and possibility of vitamin deficiencies. States that he is very restrictive in his eating habits, appetite often suppressed by his stimulant medication. She reports that since he graduated high school he has lost weight, states his clothes are fitting much looser.   I have reviewed all aspects of the patient's medical history including social, family, and surgical history.   Current Outpatient Medications  Medication Instructions   [START ON 10/19/2023] Dexmethylphenidate HCl (FOCALIN XR) 25 mg, Oral, Daily   divalproex (DEPAKOTE ER) 250 mg, Oral, Daily   FLUoxetine (PROZAC) 20 mg, Oral, Daily   guanFACINE (INTUNIV) 4 mg, Oral, Daily   hydrOXYzine (ATARAX) 10 mg, Oral, 3 times daily PRN   risperiDONE (RISPERDAL) 0.5 mg, Oral, 2 times daily    Past Medical History:  Diagnosis Date   ADHD (attention deficit hyperactivity disorder)    Anxiety    Autism    Oppositional defiant disorder     Past Surgical History:  Procedure Laterality Date   CRANIOPLASTY Bilateral 02/05   TOOTH EXTRACTION  Age 68    Family History  Problem Relation Age of Onset   ADD / ADHD Neg Hx    Alcohol abuse Neg Hx     Anxiety disorder Neg Hx    Bipolar disorder Neg Hx    Dementia Neg Hx    Depression Neg Hx    Drug abuse Neg Hx    OCD Neg Hx    Paranoid behavior Neg Hx    Physical abuse Neg Hx    Schizophrenia Neg Hx    Seizures Neg Hx    Sexual abuse Neg Hx     Social History   Socioeconomic History   Marital status: Single    Spouse name: Not on file   Number of children: Not on file   Years of education: Not on file   Highest education level: 12th grade  Occupational History   Not on file  Tobacco Use   Smoking status: Never   Smokeless tobacco: Never  Vaping Use   Vaping status: Never Used  Substance and Sexual Activity   Alcohol use: No   Drug use: Never   Sexual activity: Never  Other Topics Concern   Not on file  Social History Narrative   Not on file   Social Determinants of Health   Financial Resource Strain: Low Risk  (09/24/2023)   Overall Financial Resource Strain (CARDIA)    Difficulty of Paying Living Expenses: Not hard at all  Food Insecurity: No Food Insecurity (09/24/2023)   Hunger Vital Sign    Worried About Running Out of Food in the Last Year: Never true  Ran Out of Food in the Last Year: Never true  Transportation Needs: No Transportation Needs (09/24/2023)   PRAPARE - Administrator, Civil Service (Medical): No    Lack of Transportation (Non-Medical): No  Physical Activity: Unknown (09/24/2023)   Exercise Vital Sign    Days of Exercise per Week: 0 days    Minutes of Exercise per Session: Not on file  Stress: Stress Concern Present (09/24/2023)   [employer] of Occupational Health - Occupational Stress Questionnaire    Feeling of Stress : To some extent  Social Connections: Socially Isolated (09/24/2023)   Social Connection and Isolation Panel [NHANES]    Frequency of Communication with Friends and Family: Never    Frequency of Social Gatherings with Friends and Family: Never    Attends Religious Services: Never    Loss adjuster, chartered or Organizations: No    Attends Engineer, structural: Not on file    Marital Status: Never married  Intimate Partner Violence: Not on file    Review of Systems  All other systems reviewed and are negative.       Objective    BP 112/70 (BP Location: Left Arm, Patient Position: Sitting, Cuff Size: Normal)   Pulse (!) 105   Temp 98.9 F (37.2 C) (Oral)   Ht 5' 9.5" (1.765 m)   Wt 127 lb 14.4 oz (58 kg)   SpO2 99%   BMI 18.62 kg/m   Physical Exam Vitals reviewed.  Constitutional:      Appearance: Normal appearance. He is well-groomed and underweight.  Neck:     Thyroid: No thyromegaly.  Cardiovascular:     Rate and Rhythm: Normal rate and regular rhythm.     Heart sounds: S1 normal and S2 normal. No murmur heard. Pulmonary:     Effort: Pulmonary effort is normal.     Breath sounds: Normal breath sounds and air entry. No rales.  Abdominal:     General: Bowel sounds are normal.  Musculoskeletal:     Right lower leg: No edema.     Left lower leg: No edema.  Neurological:     Mental Status: He is alert and oriented to person, place, and time. Mental status is at baseline.     Gait: Gait is intact.  Psychiatric:        Mood and Affect: Mood normal. Affect is flat.        Behavior: Behavior is cooperative.        Cognition and Memory: Cognition is impaired.         Assessment & Plan:  Chronic tension-type headache, not intractable -     Vitamin B12 -     VITAMIN D 25 Hydroxy (Vit-D Deficiency, Fractures) -     CBC with Differential/Platelet -     TSH  Underweight -     Vitamin B12 -     VITAMIN D 25 Hydroxy (Vit-D Deficiency, Fractures) -     CBC with Differential/Platelet -     TSH  Patient has underlying ASD, ADHD and his medications are likely causing appetite suppression. I will order a full set of labs to evaluate his nutritional status. I recommended starting multivitamin gummies and looking at possible nutritional supplements like  protein shakes. Counseled patient on healthy eating.   Return in about 1 year (around 09/24/2024) for annual physical exam.   Karie Georges, MD

## 2023-10-16 ENCOUNTER — Encounter (HOSPITAL_COMMUNITY): Payer: Self-pay

## 2023-10-18 ENCOUNTER — Encounter (HOSPITAL_COMMUNITY): Payer: Self-pay | Admitting: *Deleted

## 2023-11-12 ENCOUNTER — Encounter (HOSPITAL_COMMUNITY): Payer: Self-pay

## 2023-11-12 NOTE — Progress Notes (Signed)
 BH MD/PA/NP OP Progress Note  11/15/2023 4:29 PM Kurt Mclaughlin  MRN:  [id number]  Visit Diagnosis:    ICD-10-CM   1. Autism spectrum disorder  F84.0 risperiDONE  (RISPERDAL ) 0.5 MG tablet    guanFACINE  (INTUNIV ) 4 MG TB24 ER tablet    FLUoxetine  (PROZAC ) 20 MG tablet    divalproex  (DEPAKOTE  ER) 250 MG 24 hr tablet    2. Attention deficit hyperactivity disorder (ADHD), combined type  F90.2 Dexmethylphenidate  HCl (FOCALIN  XR) 25 MG CP24    Dexmethylphenidate  HCl (FOCALIN  XR) 25 MG CP24    guanFACINE  (INTUNIV ) 4 MG TB24 ER tablet      Assessment: Kurt Mclaughlin is a 23 y.o. male with a history of Autism and ADHD who presents virtually to [facility] at [facility] for initial evaluation on 02/01/2023, following his transition from Dr. Philis after her retirement.  Patient has a long history of autism and ADHD which has been well managed for the past several years since connecting with Dr. Philis.  Prior to that patient had had an increase in mood lability, anxiety, and mood symptoms in ninth grade.  At initial visit patient reported some increased anxiety related to situational stressors.  Outside of this he notes that mood, anxiety, and attention symptoms have been well-controlled.    Cleaven Demario presents for follow-up evaluation. Today, 11/15/23, patient reports that his mood and anxiety have been stable. He handled the stressor of the move well without significant increase in anxiety per patient report. Of note patient was found to have microcytic anemia and low vitamin D  in the interim. These could of contributed to  patients prior fatigue. He has been started on iron  and vitamin d  replacement therapy. He is taking his prescribed medications and denies adverse side effects. We will continue on his current regimen and follow up in 2 months.  Plan: - Continue Focalin  XR 25 mg QD - Continue Risperidone  0.5 mg BID - Continue Guanfacine  ER 4 mg QD - Continue  Depakote  ER 250 mg QHS - Continue Prozac  20 mg QD - Continue Atarax  10 mg TID prn anxiety - CBC, CMP, Vitamin D , and folate. He is taking  - Will look into therapy options for ADHD and autism - Will submit records to college documenting his ADHD/autism diagnosis - Crisis resources reviewed - Follow up in 2 months   Chief Complaint:  Chief Complaint  Patient presents with   Follow-up   HPI: Kurt Mclaughlin presents alongside his mother.  He reports that things are going well. He finished his first semester for college and the move into the new house went well. He denied any significant increase in anxiety with the move. Patient is starting his second semester soon and will be taking one english class. He is still seeing Sam, the peer support specialist, a couple times a week for a few hours. On one day they do things around the house and the next day they go on an outing.   Patient had blood work done in the interim which showed low vitamin d  levels and microcytic anemia. His PCP has started him on iron  and Vitamin d  replacements. They will recheck levels in a few months.   Odis continues to take his psychiatric medication and denies any adverse side effects.   Past Psychiatric History: Had outpatient med management at [facility] to 01-2016, followed by Dr. Sable, and then with Dr. Philis. No prior psychiatric hospitalizations or prior suicide attempts.  Has been on Lexapro , Abilify , Dexedrine , Vyvanse ,  trazodone, propranolol, and clonidine  in the past. Currently on a regimen of Focalin  XR, guanfacine , Depakote , Risperdal , and Prozac .  Past Medical History:  Past Medical History:  Diagnosis Date   ADHD (attention deficit hyperactivity disorder)    Anxiety    Autism    Oppositional defiant disorder     Past Surgical History:  Procedure Laterality Date   CRANIOPLASTY Bilateral 02/05   TOOTH EXTRACTION  Age 85   Family History:  Family History  Problem Relation Age of Onset   ADD / ADHD  Neg Hx    Alcohol abuse Neg Hx    Anxiety disorder Neg Hx    Bipolar disorder Neg Hx    Dementia Neg Hx    Depression Neg Hx    Drug abuse Neg Hx    OCD Neg Hx    Paranoid behavior Neg Hx    Physical abuse Neg Hx    Schizophrenia Neg Hx    Seizures Neg Hx    Sexual abuse Neg Hx     Social History:  Social History   Socioeconomic History   Marital status: Single    Spouse name: Not on file   Number of children: Not on file   Years of education: Not on file   Highest education level: 12th grade  Occupational History   Not on file  Tobacco Use   Smoking status: Never   Smokeless tobacco: Never  Vaping Use   Vaping status: Never Used  Substance and Sexual Activity   Alcohol use: No   Drug use: Never   Sexual activity: Never  Other Topics Concern   Not on file  Social History Narrative   Not on file   Social Drivers of Health   Financial Resource Strain: Low Risk  (09/24/2023)   Overall Financial Resource Strain (CARDIA)    Difficulty of Paying Living Expenses: Not hard at all  Food Insecurity: No Food Insecurity (09/24/2023)   Hunger Vital Sign    Worried About Running Out of Food in the Last Year: Never true    Ran Out of Food in the Last Year: Never true  Transportation Needs: No Transportation Needs (09/24/2023)   PRAPARE - Administrator, Civil Service (Medical): No    Lack of Transportation (Non-Medical): No  Physical Activity: Unknown (09/24/2023)   Exercise Vital Sign    Days of Exercise per Week: 0 days    Minutes of Exercise per Session: Not on file  Stress: Stress Concern Present (09/24/2023)   [employer] of Occupational Health - Occupational Stress Questionnaire    Feeling of Stress : To some extent  Social Connections: Socially Isolated (09/24/2023)   Social Connection and Isolation Panel [NHANES]    Frequency of Communication with Friends and Family: Never    Frequency of Social Gatherings with Friends and Family: Never     Attends Religious Services: Never    Database Administrator or Organizations: No    Attends Engineer, Structural: Not on file    Marital Status: Never married    Allergies: No Known Allergies  Current Medications: Current Outpatient Medications  Medication Sig Dispense Refill   [START ON 12/19/2023] Dexmethylphenidate  HCl (FOCALIN  XR) 25 MG CP24 Take 1 capsule (25 mg total) by mouth daily. 30 capsule 0   [START ON 11/19/2023] Dexmethylphenidate  HCl (FOCALIN  XR) 25 MG CP24 Take 1 capsule (25 mg total) by mouth daily. 30 capsule 0   divalproex  (DEPAKOTE  ER) 250 MG 24 hr tablet  Take 1 tablet (250 mg total) by mouth daily. 90 tablet 1   FLUoxetine  (PROZAC ) 20 MG tablet Take 1 tablet (20 mg total) by mouth daily. 90 tablet 0   guanFACINE  (INTUNIV ) 4 MG TB24 ER tablet Take 1 tablet (4 mg total) by mouth daily. 90 tablet 1   hydrOXYzine  (ATARAX ) 10 MG tablet Take 1 tablet (10 mg total) by mouth 3 (three) times daily as needed. 90 tablet 0   Iron , Ferrous Sulfate , 325 (65 Fe) MG TABS Take 325 mg by mouth daily. 90 tablet 1   risperiDONE  (RISPERDAL ) 0.5 MG tablet Take 1 tablet (0.5 mg total) by mouth 2 (two) times daily. 180 tablet 1   Vitamin D , Ergocalciferol , (DRISDOL ) 1.25 MG (50000 UNIT) CAPS capsule Take 1 capsule (50,000 Units total) by mouth every 7 (seven) days. 12 capsule 1   No current facility-administered medications for this visit.     Psychiatric Specialty Exam: Review of Systems  There were no vitals taken for this visit.There is no height or weight on file to calculate BMI.  General Appearance: Fairly Groomed  Eye Contact:  Minimal  Speech:  Clear and Coherent and Normal Rate  Volume:  Normal  Mood:  Euthymic  Affect:  Congruent  Thought Process:  Coherent  Orientation:  Full (Time, Place, and Person)  Thought Content: Logical and concrete    Suicidal Thoughts:  No  Homicidal Thoughts:  No  Memory:  Immediate;   Fair  Judgement:  Fair  Insight:  Fair   Psychomotor Activity:  Normal  Concentration:  Concentration: Fair  Recall:  Fair  Fund of Knowledge: Fair  Language: Good  Akathisia:  No    AIMS (if indicated): not done  Assets:  Architect Leisure Time Physical Health  ADL's:  Intact  Cognition: WNL  Sleep:  Good   Metabolic Disorder Labs: Lab Results  Component Value Date   HGBA1C 5.2 12/25/2017   MPG 103 12/25/2017   MPG 120 (H) 04/29/2015   Lab Results  Component Value Date   PROLACTIN 15.3 (H) 12/25/2017   PROLACTIN <0.3 (L) 04/29/2015   Lab Results  Component Value Date   CHOL 186 (H) 12/25/2017   TRIG 117 (H) 12/25/2017   HDL 75 12/25/2017   CHOLHDL 2.5 12/25/2017   VLDL 12 04/29/2015   LDLCALC 90 12/25/2017   LDLCALC 93 04/29/2015   Lab Results  Component Value Date   TSH 0.72 09/25/2023    Therapeutic Level Labs: No results found for: LITHIUM No results found for: VALPROATE No results found for: CBMZ   Screenings: GAD-7    Flowsheet Row [facility] Office Visit from 12/03/2017 in [facility] at [facility]  Total GAD-7 Score 4 9      PHQ2-9    Flowsheet Row Office Visit from 02/01/2023 in [facility] Video Visit from 01/12/2021 in [facility] at [facility] Office Visit from 12/03/2017 in [facility] at [facility]  PHQ-2 Total Score 1 3 1   PHQ-9 Total Score -- 9 --      Flowsheet Row Office Visit from 02/01/2023 in [facility] Video Visit from 01/12/2021 in [facility] Out[facility]al [facility] at [facility]  C-SSRS RISK CATEGORY No Risk No Risk       Collaboration of Care: Collaboration of Care: Medication Management AEB medication  prescription  Patient/Guardian was  advised Release of Information must be obtained prior to any record release in order to collaborate their care with an outside provider. Patient/Guardian was advised if they have not already done so to contact the registration department to sign all necessary forms in order for us  to release information regarding their care.   Consent: Patient/Guardian gives verbal consent for treatment and assignment of benefits for services provided during this visit. Patient/Guardian expressed understanding and agreed to proceed.    Arvella CHRISTELLA Finder, MD 11/15/2023, 4:29 PM   Virtual Visit via Video Note  I connected with Morene Rubenstein on 11/15/23 at  3:30 PM EST by a video enabled telemedicine application and verified that I am speaking with the correct person using two identifiers.  Location: Patient: Home Provider: Home Office   I discussed the limitations of evaluation and management by telemedicine and the availability of in person appointments. The patient expressed understanding and agreed to proceed.   I discussed the assessment and treatment plan with the patient. The patient was provided an opportunity to ask questions and all were answered. The patient agreed with the plan and demonstrated an understanding of the instructions.   The patient was advised to call back or seek an in-person evaluation if the symptoms worsen or if the condition fails to improve as anticipated.  I provided 15 minutes of non-face-to-face time during this encounter.   Arvella CHRISTELLA Finder, MD

## 2023-11-15 ENCOUNTER — Telehealth (HOSPITAL_BASED_OUTPATIENT_CLINIC_OR_DEPARTMENT_OTHER): Payer: MEDICAID | Admitting: Psychiatry

## 2023-11-15 ENCOUNTER — Encounter (HOSPITAL_COMMUNITY): Payer: Self-pay | Admitting: Psychiatry

## 2023-11-15 DIAGNOSIS — F902 Attention-deficit hyperactivity disorder, combined type: Secondary | ICD-10-CM

## 2023-11-15 DIAGNOSIS — F840 Autistic disorder: Secondary | ICD-10-CM | POA: Diagnosis not present

## 2023-11-15 MED ORDER — DEXMETHYLPHENIDATE HCL ER 25 MG PO CP24: 25.0000 mg | ORAL_CAPSULE | Freq: Every day | ORAL | 0 refills | Status: DC

## 2023-11-15 MED ORDER — GUANFACINE HCL ER 4 MG PO TB24: 4.0000 mg | ORAL_TABLET | Freq: Every day | ORAL | 1 refills | Status: DC

## 2023-11-15 MED ORDER — DIVALPROEX SODIUM ER 250 MG PO TB24: 250.0000 mg | ORAL_TABLET | Freq: Every day | ORAL | 1 refills | Status: DC

## 2023-11-15 MED ORDER — FLUOXETINE HCL 20 MG PO TABS: 20.0000 mg | ORAL_TABLET | Freq: Every day | ORAL | 0 refills | Status: DC

## 2023-11-15 MED ORDER — RISPERIDONE 0.5 MG PO TABS: 0.5000 mg | ORAL_TABLET | Freq: Two times a day (BID) | ORAL | 1 refills | Status: DC

## 2023-11-20 ENCOUNTER — Telehealth (HOSPITAL_COMMUNITY): Payer: Self-pay | Admitting: *Deleted

## 2023-11-20 NOTE — Telephone Encounter (Signed)
 Accomodation paperwork sent to [facility] per pt/provider request. Informed consent signed by pt and sent with paperwork and will be scanned to pt chart.

## 2024-01-09 NOTE — Progress Notes (Unsigned)
 BH MD/PA/NP OP Progress Note  01/10/2024 3:53 PM Kurt Mclaughlin  MRN:  [id number]  Visit Diagnosis:    ICD-10-CM   1. Autism spectrum disorder  F84.0 FLUoxetine (PROZAC) 20 MG tablet    2. Attention deficit hyperactivity disorder (ADHD), combined type  F90.2 Dexmethylphenidate HCl (FOCALIN XR) 25 MG CP24    Dexmethylphenidate HCl (FOCALIN XR) 25 MG CP24    Dexmethylphenidate HCl 25 MG CP24      Assessment: Kurt Mclaughlin is a 23 y.o. male with a history of Autism and ADHD who presents virtually to [facility] at [facility] for initial evaluation on 02/01/2023, following his transition from Dr. Milana Kidney after her retirement.  Patient has a long history of autism and ADHD which has been well managed for the past several years since connecting with Dr. Milana Kidney.  Prior to that patient had had an increase in mood lability, anxiety, and mood symptoms in ninth grade.  At initial visit patient reported some increased anxiety related to situational stressors.  Outside of this he notes that mood, anxiety, and attention symptoms have been well-controlled.    Quang Thorpe presents for follow-up evaluation. Today, 01/10/24, patient reports that his mood and anxiety have been stable.  He continues to take his medication consistently denying any adverse side effects. We will continue on his current regimen and follow up in 3 months.  Plan: - Continue Focalin XR 25 mg QD - Continue Risperidone 0.5 mg BID - Continue Guanfacine ER 4 mg QD - Continue Depakote ER 250 mg QHS - Continue Prozac 20 mg QD - Continue Atarax 10 mg TID prn anxiety - CBC, CMP, Vitamin D, and folate. He is taking  - Will look into therapy options for ADHD and autism - Will submit records to college documenting his ADHD/autism diagnosis - Crisis resources reviewed - Follow up in 3 months   Chief Complaint:  Chief Complaint  Patient presents with   Follow-up   HPI: Kurt Mclaughlin presents alongside his  mother. He reports that not much has happened over the past few months.  He has finished 1 class with school and is in the process of starting another class.  Patient has had a bit of difficulty with motivation in starting this class.  He has tried to set reminders in a few different fashions however often ignores them.  We did discuss some strategies to try and improve his routine.  Though ultimately did raise the concern that perhaps it is more his own motivation that is lacking on this.  Patient did identify that next semester his classes seem more interesting which might make it better.  Things had gone well to his peer specialist over the last few months.  She has been away for about 3 weeks now and only gone for a total of 6.  Kurt Mclaughlin has been enjoying his time with her though.  He continues to take his medications consistently denying any adverse side effects.  Patient has not taken the hydroxyzine and does not endorse any concerns about anxiety to warrant taking it.  Past Psychiatric History: Had outpatient med management at [facility] to 01-2016, followed by Dr. Jannifer Franklin, and then with Dr. Milana Kidney. No prior psychiatric hospitalizations or prior suicide attempts.  Has been on Lexapro, Abilify, Dexedrine, Vyvanse, trazodone, propranolol, and clonidine in the past. Currently on a regimen of Focalin XR, guanfacine, Depakote, Risperdal, and Prozac.  Past Medical History:  Past Medical History:  Diagnosis Date   ADHD (attention deficit hyperactivity disorder)  Anxiety    Autism    Oppositional defiant disorder     Past Surgical History:  Procedure Laterality Date   CRANIOPLASTY Bilateral 02/05   TOOTH EXTRACTION  Age 76   Family History:  Family History  Problem Relation Age of Onset   ADD / ADHD Neg Hx    Alcohol abuse Neg Hx    Anxiety disorder Neg Hx    Bipolar disorder Neg Hx    Dementia Neg Hx    Depression Neg Hx    Drug abuse Neg Hx    OCD Neg Hx    Paranoid behavior Neg Hx     Physical abuse Neg Hx    Schizophrenia Neg Hx    Seizures Neg Hx    Sexual abuse Neg Hx     Social History:  Social History   Socioeconomic History   Marital status: Single    Spouse name: Not on file   Number of children: Not on file   Years of education: Not on file   Highest education level: 12th grade  Occupational History   Not on file  Tobacco Use   Smoking status: Never   Smokeless tobacco: Never  Vaping Use   Vaping status: Never Used  Substance and Sexual Activity   Alcohol use: No   Drug use: Never   Sexual activity: Never  Other Topics Concern   Not on file  Social History Narrative   Not on file   Social Drivers of Health   Financial Resource Strain: Low Risk  (09/24/2023)   Overall Financial Resource Strain (CARDIA)    Difficulty of Paying Living Expenses: Not hard at all  Food Insecurity: No Food Insecurity (09/24/2023)   Hunger Vital Sign    Worried About Running Out of Food in the Last Year: Never true    Ran Out of Food in the Last Year: Never true  Transportation Needs: No Transportation Needs (09/24/2023)   PRAPARE - Administrator, Civil Service (Medical): No    Lack of Transportation (Non-Medical): No  Physical Activity: Unknown (09/24/2023)   Exercise Vital Sign    Days of Exercise per Week: 0 days    Minutes of Exercise per Session: Not on file  Stress: Stress Concern Present (09/24/2023)   [employer] of Occupational Health - Occupational Stress Questionnaire    Feeling of Stress : To some extent  Social Connections: Socially Isolated (09/24/2023)   Social Connection and Isolation Panel [NHANES]    Frequency of Communication with Friends and Family: Never    Frequency of Social Gatherings with Friends and Family: Never    Attends Religious Services: Never    Database administrator or Organizations: No    Attends Engineer, structural: Not on file    Marital Status: Never married    Allergies: No Known  Allergies  Current Medications: Current Outpatient Medications  Medication Sig Dispense Refill   [START ON 03/21/2024] Dexmethylphenidate HCl 25 MG CP24 Take 1 capsule (25 mg total) by mouth daily. 30 capsule 0   [START ON 01/21/2024] Dexmethylphenidate HCl (FOCALIN XR) 25 MG CP24 Take 1 capsule (25 mg total) by mouth daily. 30 capsule 0   [START ON 02/21/2024] Dexmethylphenidate HCl (FOCALIN XR) 25 MG CP24 Take 1 capsule (25 mg total) by mouth daily. 30 capsule 0   divalproex (DEPAKOTE ER) 250 MG 24 hr tablet Take 1 tablet (250 mg total) by mouth daily. 90 tablet 1   FLUoxetine (PROZAC) 20 MG  tablet Take 1 tablet (20 mg total) by mouth daily. 90 tablet 0   guanFACINE (INTUNIV) 4 MG TB24 ER tablet Take 1 tablet (4 mg total) by mouth daily. 90 tablet 1   hydrOXYzine (ATARAX) 10 MG tablet Take 1 tablet (10 mg total) by mouth 3 (three) times daily as needed. 90 tablet 0   Iron, Ferrous Sulfate, 325 (65 Fe) MG TABS Take 325 mg by mouth daily. 90 tablet 1   risperiDONE (RISPERDAL) 0.5 MG tablet Take 1 tablet (0.5 mg total) by mouth 2 (two) times daily. 180 tablet 1   Vitamin D, Ergocalciferol, (DRISDOL) 1.25 MG (50000 UNIT) CAPS capsule Take 1 capsule (50,000 Units total) by mouth every 7 (seven) days. 12 capsule 1   No current facility-administered medications for this visit.     Psychiatric Specialty Exam: Review of Systems  There were no vitals taken for this visit.There is no height or weight on file to calculate BMI.  General Appearance: Fairly Groomed  Eye Contact:  Minimal  Speech:  Clear and Coherent and Normal Rate  Volume:  Normal  Mood:  Euthymic  Affect:  Congruent  Thought Process:  Coherent  Orientation:  Full (Time, Place, and Person)  Thought Content: Logical and concrete    Suicidal Thoughts:  No  Homicidal Thoughts:  No  Memory:  Immediate;   Fair  Judgement:  Fair  Insight:  Fair  Psychomotor Activity:  Normal  Concentration:  Concentration: Fair  Recall:  Fair   Fund of Knowledge: Fair  Language: Good  Akathisia:  No    AIMS (if indicated): not done  Assets:  Architect Leisure Time Physical Health  ADL's:  Intact  Cognition: WNL  Sleep:  Good   Metabolic Disorder Labs: Lab Results  Component Value Date   HGBA1C 5.2 12/25/2017   MPG 103 12/25/2017   MPG 120 (H) 04/29/2015   Lab Results  Component Value Date   PROLACTIN 15.3 (H) 12/25/2017   PROLACTIN <0.3 (L) 04/29/2015   Lab Results  Component Value Date   CHOL 186 (H) 12/25/2017   TRIG 117 (H) 12/25/2017   HDL 75 12/25/2017   CHOLHDL 2.5 12/25/2017   VLDL 12 04/29/2015   LDLCALC 90 12/25/2017   LDLCALC 93 04/29/2015   Lab Results  Component Value Date   TSH 0.72 09/25/2023    Therapeutic Level Labs: No results found for: "LITHIUM" No results found for: "VALPROATE" No results found for: "CBMZ"   Screenings: GAD-7    Flowsheet Row Office Visit from 02/01/2023 in [facility] Office Visit from 12/03/2017 in [facility] at [facility]  Total GAD-7 Score 4 9      PHQ2-9    Flowsheet Row Office Visit from 02/01/2023 in [facility] Video Visit from 01/12/2021 in [facility] Out[facility] Behavioral Health at [facility] Office Visit from 12/03/2017 in [facility] Outpatient Behavioral Health at [facility]  PHQ-2 Total Score 1 3 1   PHQ-9 Total Score -- 9 --      Flowsheet Row Office Visit from 02/01/2023 in [facility] Video Visit from 01/12/2021 in [facility] Outpatient Behavioral Health at [facility]  C-SSRS RISK CATEGORY No Risk No Risk       Collaboration of Care: Collaboration of Care: Medication Management AEB medication prescription  Patient/Guardian was advised Release of Information must be obtained prior to any record release in  order to collaborate their care  with an outside provider. Patient/Guardian was advised if they have not already done so to contact the registration department to sign all necessary forms in order for [place] to release information regarding their care.   Consent: Patient/Guardian gives verbal consent for treatment and assignment of benefits for services provided during this visit. Patient/Guardian expressed understanding and agreed to proceed.    Stasia Cavalier, MD 01/10/2024, 3:53 PM   Virtual Visit via Video Note  I connected with Brunetta Genera on 01/10/24 at  3:30 PM EST by a video enabled telemedicine application and verified that I am speaking with the correct person using two identifiers.  Location: Patient: Home Provider: Home Office   I discussed the limitations of evaluation and management by telemedicine and the availability of in person appointments. The patient expressed understanding and agreed to proceed.   I discussed the assessment and treatment plan with the patient. The patient was provided an opportunity to ask questions and all were answered. The patient agreed with the plan and demonstrated an understanding of the instructions.   The patient was advised to call back or seek an in-person evaluation if the symptoms worsen or if the condition fails to improve as anticipated.  I provided 15 minutes of non-face-to-face time during this encounter.   Stasia Cavalier, MD

## 2024-01-10 ENCOUNTER — Encounter (HOSPITAL_COMMUNITY): Payer: Self-pay | Admitting: Psychiatry

## 2024-01-10 ENCOUNTER — Telehealth (HOSPITAL_COMMUNITY): Payer: MEDICAID | Admitting: Psychiatry

## 2024-01-10 DIAGNOSIS — F902 Attention-deficit hyperactivity disorder, combined type: Secondary | ICD-10-CM | POA: Diagnosis not present

## 2024-01-10 DIAGNOSIS — F840 Autistic disorder: Secondary | ICD-10-CM | POA: Diagnosis not present

## 2024-01-10 MED ORDER — DEXMETHYLPHENIDATE HCL ER 25 MG PO CP24: 25.0000 mg | ORAL_CAPSULE | Freq: Every day | ORAL | 0 refills | Status: DC

## 2024-01-10 MED ORDER — DEXMETHYLPHENIDATE HCL ER 25 MG PO CP24
25.0000 mg | ORAL_CAPSULE | Freq: Every day | ORAL | 0 refills | Status: DC
Start: 2024-01-21 — End: 2024-04-10

## 2024-01-10 MED ORDER — FLUOXETINE HCL 20 MG PO TABS: 20.0000 mg | ORAL_TABLET | Freq: Every day | ORAL | 0 refills | Status: DC

## 2024-01-17 ENCOUNTER — Other Ambulatory Visit (HOSPITAL_COMMUNITY): Payer: Self-pay | Admitting: Psychiatry

## 2024-01-17 DIAGNOSIS — F840 Autistic disorder: Secondary | ICD-10-CM

## 2024-01-20 ENCOUNTER — Encounter (HOSPITAL_COMMUNITY): Payer: Self-pay

## 2024-03-17 ENCOUNTER — Ambulatory Visit (INDEPENDENT_AMBULATORY_CARE_PROVIDER_SITE_OTHER): Payer: MEDICAID | Admitting: Family Medicine

## 2024-03-17 ENCOUNTER — Encounter: Payer: Self-pay | Admitting: Family Medicine

## 2024-03-17 VITALS — BP 100/70 | HR 93 | Temp 98.4°F | Ht 69.5 in | Wt 129.4 lb

## 2024-03-17 DIAGNOSIS — D508 Other iron deficiency anemias: Secondary | ICD-10-CM | POA: Insufficient documentation

## 2024-03-17 DIAGNOSIS — E559 Vitamin D deficiency, unspecified: Secondary | ICD-10-CM | POA: Insufficient documentation

## 2024-03-17 NOTE — Progress Notes (Signed)
   Established Patient Office Visit  Subjective   Patient ID: Kurt Mclaughlin, male    DOB: 11/01/2001  Age: 23 y.o. MRN: [id number]  Chief Complaint  Patient presents with   Medical Management of Chronic Issues    Pt is here for follow up today on his blood work and chronic headaches. Patient is reporting that his headaches are not quite as frequent, still taking a tylenol every morning, not quite as bad as far as the intensity goes.     Current Outpatient Medications  Medication Instructions   Dexmethylphenidate  HCl (FOCALIN  XR) 25 mg, Oral, Daily   Dexmethylphenidate  HCl (FOCALIN  XR) 25 mg, Oral, Daily   [START ON 03/21/2024] Dexmethylphenidate  HCl 25 mg, Oral, Daily   divalproex  (DEPAKOTE  ER) 250 mg, Oral, Daily   FLUoxetine  (PROZAC ) 20 mg, Oral, Daily   guanFACINE  (INTUNIV ) 4 mg, Oral, Daily   hydrOXYzine  (ATARAX ) 10 mg, Oral, 3 times daily PRN   Iron  (Ferrous Sulfate ) 325 mg, Oral, Daily   risperiDONE  (RISPERDAL ) 0.5 mg, Oral, 2 times daily   Vitamin D  (Ergocalciferol ) (DRISDOL ) 50,000 Units, Oral, Every 7 days    Patient Active Problem List   Diagnosis Date Noted   Iron  deficiency anemia secondary to inadequate dietary iron  intake 03/17/2024   Vitamin D  deficiency 03/17/2024   Chronic tension headaches 09/25/2023   Generalized anxiety disorder 04/21/2014   ADHD (attention deficit hyperactivity disorder), combined type 12/25/2012   Autism spectrum disorder 12/25/2012      Review of Systems  All other systems reviewed and are negative.     Objective:     BP 100/70   Pulse 93   Temp 98.4 F (36.9 C) (Oral)   Ht 5' 9.5" (1.765 m)   Wt 129 lb 6.4 oz (58.7 kg)   SpO2 96%   BMI 18.84 kg/m    Physical Exam Vitals reviewed.  Constitutional:      Appearance: Normal appearance. He is normal weight.  Cardiovascular:     Rate and Rhythm: Normal rate and regular rhythm.     Heart sounds: Normal heart sounds.  Pulmonary:     Effort: Pulmonary effort is normal.      Breath sounds: Normal breath sounds. No wheezing.  Neurological:     Mental Status: He is alert and oriented to person, place, and time. Mental status is at baseline.      No results found for any visits on 03/17/24.    The ASCVD Risk score (Arnett DK, et al., 2019) failed to calculate for the following reasons:   The 2019 ASCVD risk score is only valid for ages 66 to 56    Assessment & Plan:  Vitamin D  deficiency Assessment & Plan: Has been on 6 months of replacement, will check new labs, he reports improvement in his headaches overall.  Orders: -     VITAMIN D  25 Hydroxy (Vit-D Deficiency, Fractures); Future  Iron  deficiency anemia secondary to inadequate dietary iron  intake Assessment & Plan: On iron  supplements daily, eating patterns are still restrictive due to ASD, will recheck labs today and decide on supplementation levels after results are known.   Orders: -     Iron , TIBC and Ferritin Panel; Future -     CBC; Future     Return in about 6 months (around 09/17/2024) for annual physical exam.    Aida House, MD

## 2024-03-17 NOTE — Assessment & Plan Note (Signed)
 On iron  supplements daily, eating patterns are still restrictive due to ASD, will recheck labs today and decide on supplementation levels after results are known.

## 2024-03-17 NOTE — Assessment & Plan Note (Signed)
 Has been on 6 months of replacement, will check new labs, he reports improvement in his headaches overall.

## 2024-03-18 ENCOUNTER — Ambulatory Visit: Payer: Self-pay | Admitting: Family Medicine

## 2024-03-18 LAB — CBC
HCT: 46.6 % (ref 39.0–52.0)
Hemoglobin: 15.7 g/dL (ref 13.0–17.0)
MCHC: 33.6 g/dL (ref 30.0–36.0)
MCV: 88.2 fl (ref 78.0–100.0)
Platelets: 388 10*3/uL (ref 150.0–400.0)
RBC: 5.28 Mil/uL (ref 4.22–5.81)
RDW: 12.7 % (ref 11.5–14.6)
WBC: 7.4 10*3/uL (ref 4.5–10.5)

## 2024-03-18 LAB — VITAMIN D 25 HYDROXY (VIT D DEFICIENCY, FRACTURES): VITD: 56.8 ng/mL (ref 30.00–100.00)

## 2024-03-18 LAB — IRON,TIBC AND FERRITIN PANEL
%SAT: 30 % (ref 20–48)
Ferritin: 38 ng/mL (ref 38–380)
Iron: 97 ug/dL (ref 50–195)
TIBC: 323 ug/dL (ref 250–425)

## 2024-03-21 ENCOUNTER — Other Ambulatory Visit: Payer: Self-pay | Admitting: Family Medicine

## 2024-03-21 DIAGNOSIS — D508 Other iron deficiency anemias: Secondary | ICD-10-CM

## 2024-03-21 NOTE — Telephone Encounter (Signed)
 Message sent PCP to change sigs per lab test results.

## 2024-04-09 NOTE — Progress Notes (Unsigned)
 [facility] MD/PA/NP OP Progress Note  04/10/2024 3:49 PM Kurt Mclaughlin  MRN:  [id number]  Visit Diagnosis:    ICD-10-CM   1. Autism spectrum disorder  F84.0 guanFACINE  (INTUNIV ) 4 MG TB24 ER tablet    risperiDONE  (RISPERDAL ) 0.5 MG tablet    FLUoxetine  (PROZAC ) 20 MG tablet    divalproex  (DEPAKOTE  ER) 250 MG 24 hr tablet    2. Attention deficit hyperactivity disorder (ADHD), combined type  F90.2 guanFACINE  (INTUNIV ) 4 MG TB24 ER tablet    Dexmethylphenidate  HCl (FOCALIN  XR) 25 MG CP24    Dexmethylphenidate  HCl (FOCALIN  XR) 25 MG CP24    Dexmethylphenidate  HCl 25 MG CP24     Assessment: Kurt Mclaughlin is a 23 y.o. male with a history of Autism and ADHD who presents virtually to [facility] at [facility] for initial evaluation on 02/01/2023, following his transition from Dr. Luvenia Salvage after her retirement.  Patient has a long history of autism and ADHD which has been well managed for the past several years since connecting with Dr. Luvenia Salvage.  Prior to that patient had had an increase in mood lability, anxiety, and mood symptoms in ninth grade.  At initial visit patient reported some increased anxiety related to situational stressors.  Outside of this he notes that mood, anxiety, and attention symptoms have been well-controlled.    Kurt Mclaughlin presents for follow-up evaluation. Today, 04/10/24, patient reports that mood and anxiety been stable.  He is taking medication consistently without any adverse side effects.  We will continue on his current regimen and follow up in 3 months.  Patient could benefit from getting a baseline EKG given he is taking stimulant medication and maternal grand father has history of potential MI.  Plan: - Continue Focalin  XR 25 mg QD - Continue Risperidone  0.5 mg BID - Continue Guanfacine  ER 4 mg QD - Continue Depakote  ER 250 mg QHS - Continue Prozac  20 mg QD - Continue Atarax  10 mg TID prn anxiety - CBC, CMP, Vitamin D , and folate. He is  taking  - Will look into therapy options for ADHD and autism - Will submit records to college documenting his ADHD/autism diagnosis - Crisis resources reviewed - Follow up in 3 months   Chief Complaint:  Chief Complaint  Patient presents with   Follow-up   HPI: Kurt Mclaughlin presents alongside his mother. He reports that he is good with not much happening over the past 3 months. He finished his semester which went well, we will start a new one in the fall.  He does not have any plans for summer break other than to go to [place] after his birthday in July.  Patient continues to meet with Sam, his peer support specialist, twice a week which is going well.  They have made trips out to the bank and the post office.  They have also worked to label light switches around the home and do things like laundry.  He is taking medications consistently denying any adverse side effects.  Patient recently had some blood work and vitamin D  levels were improved along with iron .  This has allowed him to reduce the iron  supplements to every other day and patient is been taking a multivitamin which has vitamin D .  We did discuss the possibility of getting an EKG at his next primary care visit just to have a baseline considering he is on stimulant medication.  Past Psychiatric History: Had outpatient med management at [facility] to 01-2016, followed by Dr. Akintayo, and then  with Dr. Luvenia Salvage. No prior psychiatric hospitalizations or prior suicide attempts.  Has been on Lexapro , Abilify , Dexedrine , Vyvanse , trazodone, propranolol, and clonidine  in the past. Currently on a regimen of Focalin  XR, guanfacine , Depakote , Risperdal , and Prozac .  Past Medical History:  Past Medical History:  Diagnosis Date   ADHD (attention deficit hyperactivity disorder)    Anxiety    Autism    Oppositional defiant disorder     Past Surgical History:  Procedure Laterality Date   CRANIOPLASTY Bilateral 02/05   TOOTH EXTRACTION  Age 79    Family History:  Family History  Problem Relation Age of Onset   ADD / ADHD Neg Hx    Alcohol abuse Neg Hx    Anxiety disorder Neg Hx    Bipolar disorder Neg Hx    Dementia Neg Hx    Depression Neg Hx    Drug abuse Neg Hx    OCD Neg Hx    Paranoid behavior Neg Hx    Physical abuse Neg Hx    Schizophrenia Neg Hx    Seizures Neg Hx    Sexual abuse Neg Hx     Social History:  Social History   Socioeconomic History   Marital status: Single    Spouse name: Not on file   Number of children: Not on file   Years of education: Not on file   Highest education level: 12th grade  Occupational History   Not on file  Tobacco Use   Smoking status: Never   Smokeless tobacco: Never  Vaping Use   Vaping status: Never Used  Substance and Sexual Activity   Alcohol use: No   Drug use: Never   Sexual activity: Never  Other Topics Concern   Not on file  Social History Narrative   Not on file   Social Drivers of Health   Financial Resource Strain: Low Risk  (09/24/2023)   Overall Financial Resource Strain (CARDIA)    Difficulty of Paying Living Expenses: Not hard at all  Food Insecurity: No Food Insecurity (09/24/2023)   Hunger Vital Sign    Worried About Running Out of Food in the Last Year: Never true    Ran Out of Food in the Last Year: Never true  Transportation Needs: No Transportation Needs (09/24/2023)   PRAPARE - Administrator, Civil Service (Medical): No    Lack of Transportation (Non-Medical): No  Physical Activity: Unknown (09/24/2023)   Exercise Vital Sign    Days of Exercise per Week: 0 days    Minutes of Exercise per Session: Not on file  Stress: Stress Concern Present (09/24/2023)   [employer] of Occupational Health - Occupational Stress Questionnaire    Feeling of Stress : To some extent  Social Connections: Socially Isolated (09/24/2023)   Social Connection and Isolation Panel [NHANES]    Frequency of Communication with Friends and  Family: Never    Frequency of Social Gatherings with Friends and Family: Never    Attends Religious Services: Never    Database administrator or Organizations: No    Attends Engineer, structural: Not on file    Marital Status: Never married    Allergies: No Known Allergies  Current Medications: Current Outpatient Medications  Medication Sig Dispense Refill   [START ON 04/25/2024] Dexmethylphenidate  HCl (FOCALIN  XR) 25 MG CP24 Take 1 capsule (25 mg total) by mouth daily. 30 capsule 0   [START ON 05/23/2024] Dexmethylphenidate  HCl (FOCALIN  XR) 25 MG CP24 Take 1 capsule (  25 mg total) by mouth daily. 30 capsule 0   [START ON 06/23/2024] Dexmethylphenidate  HCl 25 MG CP24 Take 1 capsule (25 mg total) by mouth daily. 30 capsule 0   divalproex  (DEPAKOTE  ER) 250 MG 24 hr tablet Take 1 tablet (250 mg total) by mouth daily. 90 tablet 1   ferrous sulfate  325 (65 FE) MG tablet TAKE 1 TABLET BY MOUTH EVERY DAY 90 tablet 1   FLUoxetine  (PROZAC ) 20 MG tablet Take 1 tablet (20 mg total) by mouth daily. 90 tablet 0   guanFACINE  (INTUNIV ) 4 MG TB24 ER tablet Take 1 tablet (4 mg total) by mouth daily. 90 tablet 1   hydrOXYzine  (ATARAX ) 10 MG tablet Take 1 tablet (10 mg total) by mouth 3 (three) times daily as needed. 90 tablet 0   risperiDONE  (RISPERDAL ) 0.5 MG tablet Take 1 tablet (0.5 mg total) by mouth 2 (two) times daily. 180 tablet 1   Vitamin D , Ergocalciferol , (DRISDOL ) 1.25 MG (50000 UNIT) CAPS capsule Take 1 capsule (50,000 Units total) by mouth every 7 (seven) days. 12 capsule 1   No current facility-administered medications for this visit.     Psychiatric Specialty Exam: Review of Systems  There were no vitals taken for this visit.There is no height or weight on file to calculate BMI.  General Appearance: Fairly Groomed  Eye Contact:  Minimal  Speech:  Clear and Coherent and Normal Rate  Volume:  Normal  Mood:  Euthymic  Affect:  Congruent  Thought Process:  Coherent  Orientation:   Full (Time, Place, and Person)  Thought Content: Logical and concrete   Suicidal Thoughts:  No  Homicidal Thoughts:  No  Memory:  Immediate;   Fair  Judgement:  Fair  Insight:  Fair  Psychomotor Activity:  Normal  Concentration:  Concentration: Fair  Recall:  Fair  Fund of Knowledge: Fair  Language: Good  Akathisia:  No    AIMS (if indicated): not done  Assets:  Architect Leisure Time Physical Health  ADL's:  Intact  Cognition: WNL  Sleep:  Good   Metabolic Disorder Labs: Lab Results  Component Value Date   HGBA1C 5.2 12/25/2017   MPG 103 12/25/2017   MPG 120 (H) 04/29/2015   Lab Results  Component Value Date   PROLACTIN 15.3 (H) 12/25/2017   PROLACTIN <0.3 (L) 04/29/2015   Lab Results  Component Value Date   CHOL 186 (H) 12/25/2017   TRIG 117 (H) 12/25/2017   HDL 75 12/25/2017   CHOLHDL 2.5 12/25/2017   VLDL 12 04/29/2015   LDLCALC 90 12/25/2017   LDLCALC 93 04/29/2015   Lab Results  Component Value Date   TSH 0.72 09/25/2023    Therapeutic Level Labs: No results found for: "LITHIUM" No results found for: "VALPROATE" No results found for: "CBMZ"   Screenings: GAD-7    Flowsheet Row [facility] Office Visit from 12/03/2017 in [facility] at [facility]  Total GAD-7 Score 4 9      PHQ2-9    Flowsheet Row Office Visit from 03/17/2024 in [facility] Office Visit from 02/01/2023 in [facility] Video Visit from 01/12/2021 in [facility] at [facility] Office Visit from 12/03/2017 in [facility] at [facility]  PHQ-2 Total Score 0 1 3 1   PHQ-9 Total Score 0 -- 9 --      Flowsheet  Row Office Visit from 02/01/2023 in [facility] Video Visit from 01/12/2021 in [facility] at [facility]  C-SSRS RISK CATEGORY No Risk No Risk       Collaboration of Care: Collaboration of Care: Medication Management AEB medication prescription and Primary Care Provider AEB chart review  Patient/Guardian was advised Release of Information must be obtained prior to any record release in order to collaborate their care with an outside provider. Patient/Guardian was advised if they have not already done so to contact the registration department to sign all necessary forms in order for us  to release information regarding their care.   Consent: Patient/Guardian gives verbal consent for treatment and assignment of benefits for services provided during this visit. Patient/Guardian expressed understanding and agreed to proceed.    Yves Herb, MD 04/10/2024, 3:49 PM   Virtual Visit via Video Note  I connected with Armida Berry on 04/10/24 at  3:30 PM EDT by a video enabled telemedicine application and verified that I am speaking with the correct person using two identifiers.  Location: Patient: Home Provider: Home Office   I discussed the limitations of evaluation and management by telemedicine and the availability of in person appointments. The patient expressed understanding and agreed to proceed.   I discussed the assessment and treatment plan with the patient. The patient was provided an opportunity to ask questions and all were answered. The patient agreed with the plan and demonstrated an understanding of the instructions.   The patient was advised to call back or seek an in-person evaluation if the symptoms worsen or if the condition fails to improve as anticipated.  I provided 15 minutes of non-face-to-face time during this encounter.   Yves Herb, MD

## 2024-04-10 ENCOUNTER — Encounter (HOSPITAL_COMMUNITY): Payer: Self-pay | Admitting: Psychiatry

## 2024-04-10 ENCOUNTER — Telehealth (HOSPITAL_COMMUNITY): Payer: MEDICAID | Admitting: Psychiatry

## 2024-04-10 ENCOUNTER — Other Ambulatory Visit (HOSPITAL_COMMUNITY): Payer: Self-pay | Admitting: Psychiatry

## 2024-04-10 DIAGNOSIS — F902 Attention-deficit hyperactivity disorder, combined type: Secondary | ICD-10-CM | POA: Diagnosis not present

## 2024-04-10 DIAGNOSIS — F840 Autistic disorder: Secondary | ICD-10-CM | POA: Diagnosis not present

## 2024-04-10 MED ORDER — RISPERIDONE 0.5 MG PO TABS: 0.5000 mg | ORAL_TABLET | Freq: Two times a day (BID) | ORAL | 1 refills | Status: DC

## 2024-04-10 MED ORDER — DIVALPROEX SODIUM ER 250 MG PO TB24: 250.0000 mg | ORAL_TABLET | Freq: Every day | ORAL | 1 refills | Status: DC

## 2024-04-10 MED ORDER — GUANFACINE HCL ER 4 MG PO TB24: 4.0000 mg | ORAL_TABLET | Freq: Every day | ORAL | 1 refills | Status: DC

## 2024-04-10 MED ORDER — FLUOXETINE HCL 20 MG PO TABS
20.0000 mg | ORAL_TABLET | Freq: Every day | ORAL | 0 refills | Status: DC
Start: 2024-04-10 — End: 2024-07-10

## 2024-04-10 MED ORDER — DEXMETHYLPHENIDATE HCL ER 25 MG PO CP24
25.0000 mg | ORAL_CAPSULE | Freq: Every day | ORAL | 0 refills | Status: DC
Start: 2024-06-23 — End: 2024-07-10

## 2024-04-10 MED ORDER — DEXMETHYLPHENIDATE HCL ER 25 MG PO CP24: 25.0000 mg | ORAL_CAPSULE | Freq: Every day | ORAL | 0 refills | Status: DC

## 2024-04-10 MED ORDER — DEXMETHYLPHENIDATE HCL ER 25 MG PO CP24
25.0000 mg | ORAL_CAPSULE | Freq: Every day | ORAL | 0 refills | Status: DC
Start: 2024-05-23 — End: 2024-07-10

## 2024-07-08 NOTE — Progress Notes (Unsigned)
 BH MD/PA/NP OP Progress Note  07/10/2024 3:10 PM Kurt Mclaughlin  MRN:  [id number]  Visit Diagnosis:    ICD-10-CM   1. Autism spectrum disorder  F84.0 FLUoxetine  (PROZAC ) 20 MG tablet    2. Attention deficit hyperactivity disorder (ADHD), combined type  F90.2 Dexmethylphenidate  HCl (FOCALIN  XR) 25 MG CP24    Dexmethylphenidate  HCl 25 MG CP24    Dexmethylphenidate  HCl (FOCALIN  XR) 25 MG CP24      Assessment: Kurt Mclaughlin is a 23 y.o. male with a history of Autism and ADHD who presents virtually to [facility] at [facility] for initial evaluation on 02/01/2023, following his transition from Dr. Philis after her retirement.  Patient has a long history of autism and ADHD which has been well managed for the past several years since connecting with Dr. Philis.  Prior to that patient had had an increase in mood lability, anxiety, and mood symptoms in ninth grade.  At initial visit patient reported some increased anxiety related to situational stressors.  Outside of this he notes that mood, anxiety, and attention symptoms have been well-controlled.    Parthiv Mucci presents for follow-up evaluation. Today, 07/10/24, patient reports that mood symptoms have been stable and he denies any anxiety interim.  Continues take his medications consistently without any adverse side effects.  We will continue current regimen and follow-up in 3 months  Patient could benefit from getting a baseline EKG given he is taking stimulant medication and maternal grand father has history of potential MI.  Plan: - Continue Focalin  XR 25 mg QD - Continue Risperidone  0.5 mg BID - Continue Guanfacine  ER 4 mg QD - Continue Depakote  ER 250 mg QHS - Continue Prozac  20 mg QD - Continue Atarax  10 mg TID prn anxiety - CBC, CMP, Vitamin D , and folate. He is taking  - Will look into therapy options for ADHD and autism - Will submit records to college documenting his ADHD/autism diagnosis - Crisis  resources reviewed - Follow up in 3 months   Chief Complaint:  Chief Complaint  Patient presents with   Follow-up   HPI: Kurt Mclaughlin presents alongside his mother. He reports that he is good and the last 3 months were uneventful.  He did go to [place] twice once for vacation and again for a funeral.  He also started school 3 weeks ago.  He is retaking a critical thinking class and took in the last couple semesters.  So far he believes that the classes are going well though he has not yet received any grades.  Patient has been a little bit less active outside of the house recently.  Notably his peers support specialist got another job and they are looking for a new person to take over.  His mother has made plans to try and engage in some of this however navigating work has been a hurdle.  He continues take his medication consistently denying any adverse side effects.  Will continue on his current regimen.  Patient has PCP appointment in November and encouraged him to get EKG at that time.  Past Psychiatric History: Had outpatient med management at [facility] to 01-2016, followed by Dr. Sable, and then with Dr. Philis. No prior psychiatric hospitalizations or prior suicide attempts.  Has been on Lexapro , Abilify , Dexedrine , Vyvanse , trazodone, propranolol, and clonidine  in the past. Currently on a regimen of Focalin  XR, guanfacine , Depakote , Risperdal , and Prozac .  Past Medical History:  Past Medical History:  Diagnosis Date   ADHD (attention deficit  hyperactivity disorder)    Anxiety    Autism    Oppositional defiant disorder     Past Surgical History:  Procedure Laterality Date   CRANIOPLASTY Bilateral 02/05   TOOTH EXTRACTION  Age 51   Family History:  Family History  Problem Relation Age of Onset   ADD / ADHD Neg Hx    Alcohol abuse Neg Hx    Anxiety disorder Neg Hx    Bipolar disorder Neg Hx    Dementia Neg Hx    Depression Neg Hx    Drug abuse Neg Hx    OCD Neg Hx    Paranoid  behavior Neg Hx    Physical abuse Neg Hx    Schizophrenia Neg Hx    Seizures Neg Hx    Sexual abuse Neg Hx     Social History:  Social History   Socioeconomic History   Marital status: Single    Spouse name: Not on file   Number of children: Not on file   Years of education: Not on file   Highest education level: 12th grade  Occupational History   Not on file  Tobacco Use   Smoking status: Never   Smokeless tobacco: Never  Vaping Use   Vaping status: Never Used  Substance and Sexual Activity   Alcohol use: No   Drug use: Never   Sexual activity: Never  Other Topics Concern   Not on file  Social History Narrative   Not on file   Social Drivers of Health   Financial Resource Strain: Low Risk  (09/24/2023)   Overall Financial Resource Strain (CARDIA)    Difficulty of Paying Living Expenses: Not hard at all  Food Insecurity: No Food Insecurity (09/24/2023)   Hunger Vital Sign    Worried About Running Out of Food in the Last Year: Never true    Ran Out of Food in the Last Year: Never true  Transportation Needs: No Transportation Needs (09/24/2023)   PRAPARE - Administrator, Civil Service (Medical): No    Lack of Transportation (Non-Medical): No  Physical Activity: Unknown (09/24/2023)   Exercise Vital Sign    Days of Exercise per Week: 0 days    Minutes of Exercise per Session: Not on file  Stress: Stress Concern Present (09/24/2023)   [employer] of Occupational Health - Occupational Stress Questionnaire    Feeling of Stress : To some extent  Social Connections: Socially Isolated (09/24/2023)   Social Connection and Isolation Panel    Frequency of Communication with Friends and Family: Never    Frequency of Social Gatherings with Friends and Family: Never    Attends Religious Services: Never    Database administrator or Organizations: No    Attends Engineer, structural: Not on file    Marital Status: Never married    Allergies: No  Known Allergies  Current Medications: Current Outpatient Medications  Medication Sig Dispense Refill   [START ON 07/25/2024] Dexmethylphenidate  HCl (FOCALIN  XR) 25 MG CP24 Take 1 capsule (25 mg total) by mouth daily. 30 capsule 0   [START ON 09/24/2024] Dexmethylphenidate  HCl (FOCALIN  XR) 25 MG CP24 Take 1 capsule (25 mg total) by mouth daily. 30 capsule 0   [START ON 08/25/2024] Dexmethylphenidate  HCl 25 MG CP24 Take 1 capsule (25 mg total) by mouth daily. 30 capsule 0   divalproex  (DEPAKOTE  ER) 250 MG 24 hr tablet Take 1 tablet (250 mg total) by mouth daily. 90 tablet 1  ferrous sulfate  325 (65 FE) MG tablet TAKE 1 TABLET BY MOUTH EVERY DAY 90 tablet 1   FLUoxetine  (PROZAC ) 20 MG tablet Take 1 tablet (20 mg total) by mouth daily. 90 tablet 0   guanFACINE  (INTUNIV ) 4 MG TB24 ER tablet Take 1 tablet (4 mg total) by mouth daily. 90 tablet 1   hydrOXYzine  (ATARAX ) 10 MG tablet Take 1 tablet (10 mg total) by mouth 3 (three) times daily as needed. 90 tablet 0   risperiDONE  (RISPERDAL ) 0.5 MG tablet Take 1 tablet (0.5 mg total) by mouth 2 (two) times daily. 180 tablet 1   Vitamin D , Ergocalciferol , (DRISDOL ) 1.25 MG (50000 UNIT) CAPS capsule Take 1 capsule (50,000 Units total) by mouth every 7 (seven) days. 12 capsule 1   No current facility-administered medications for this visit.     Psychiatric Specialty Exam: Review of Systems  There were no vitals taken for this visit.There is no height or weight on file to calculate BMI.  General Appearance: Fairly Groomed  Eye Contact:  Minimal  Speech:  Clear and Coherent and Normal Rate  Volume:  Normal  Mood:  Euthymic  Affect:  Congruent  Thought Process:  Coherent  Orientation:  Full (Time, Place, and Person)  Thought Content: Logical and concrete   Suicidal Thoughts:  No  Homicidal Thoughts:  No  Memory:  Immediate;   Fair  Judgement:  Fair  Insight:  Fair  Psychomotor Activity:  Normal  Concentration:  Concentration: Fair  Recall:   Fair  Fund of Knowledge: Fair  Language: Good  Akathisia:  No    AIMS (if indicated): not done  Assets:  Architect Leisure Time Physical Health  ADL's:  Intact  Cognition: WNL  Sleep:  Good   Metabolic Disorder Labs: Lab Results  Component Value Date   HGBA1C 5.2 12/25/2017   MPG 103 12/25/2017   MPG 120 (H) 04/29/2015   Lab Results  Component Value Date   PROLACTIN 15.3 (H) 12/25/2017   PROLACTIN <0.3 (L) 04/29/2015   Lab Results  Component Value Date   CHOL 186 (H) 12/25/2017   TRIG 117 (H) 12/25/2017   HDL 75 12/25/2017   CHOLHDL 2.5 12/25/2017   VLDL 12 04/29/2015   LDLCALC 90 12/25/2017   LDLCALC 93 04/29/2015   Lab Results  Component Value Date   TSH 0.72 09/25/2023    Therapeutic Level Labs: No results found for: LITHIUM No results found for: VALPROATE No results found for: CBMZ   Screenings: GAD-7    Flowsheet Row [facility] Office Visit from 12/03/2017 in [facility]  Total GAD-7 Score 4 9   PHQ2-9    Flowsheet Row Office Visit from 03/17/2024 in [facility] [place] Office Visit from 02/01/2023 in [facility] Video Visit from 01/12/2021 in [facility] at [facility] Office Visit from 12/03/2017 in [facility] at [facility]  PHQ-2 Total Score 0 1 3 1   PHQ-9 Total Score 0 -- 9 --   Flowsheet Row Office Visit from 02/01/2023 [facility] Video Visit from 01/12/2021 in [facility] at [facility]  C-SSRS RISK CATEGORY No Risk No Risk    Collaboration of Care: Collaboration of Care: Medication Management AEB medication prescription and Primary Care Provider AEB  chart review  Patient/Guardian was advised Release of Information  must be obtained prior to any record release in order to collaborate their care with an outside provider. Patient/Guardian was advised if they have not already done so to contact the registration department to sign all necessary forms in order for us  to release information regarding their care.   Consent: Patient/Guardian gives verbal consent for treatment and assignment of benefits for services provided during this visit. Patient/Guardian expressed understanding and agreed to proceed.    Arvella CHRISTELLA Finder, MD 07/10/2024, 3:10 PM   Virtual Visit via Video Note  I connected with Morene Rubenstein on 07/10/24 at  3:00 PM EDT by a video enabled telemedicine application and verified that I am speaking with the correct person using two identifiers.  Location: Patient: Home Provider: Home Office   I discussed the limitations of evaluation and management by telemedicine and the availability of in person appointments. The patient expressed understanding and agreed to proceed.   I discussed the assessment and treatment plan with the patient. The patient was provided an opportunity to ask questions and all were answered. The patient agreed with the plan and demonstrated an understanding of the instructions.   The patient was advised to call back or seek an in-person evaluation if the symptoms worsen or if the condition fails to improve as anticipated.  I provided 15 minutes of non-face-to-face time during this encounter.   Arvella CHRISTELLA Finder, MD

## 2024-07-10 ENCOUNTER — Telehealth (HOSPITAL_BASED_OUTPATIENT_CLINIC_OR_DEPARTMENT_OTHER): Payer: MEDICAID | Admitting: Psychiatry

## 2024-07-10 ENCOUNTER — Encounter (HOSPITAL_COMMUNITY): Payer: Self-pay | Admitting: Psychiatry

## 2024-07-10 DIAGNOSIS — F840 Autistic disorder: Secondary | ICD-10-CM

## 2024-07-10 DIAGNOSIS — F902 Attention-deficit hyperactivity disorder, combined type: Secondary | ICD-10-CM | POA: Diagnosis not present

## 2024-07-10 MED ORDER — FLUOXETINE HCL 20 MG PO TABS: 20.0000 mg | ORAL_TABLET | Freq: Every day | ORAL | 0 refills | Status: DC

## 2024-07-10 MED ORDER — DEXMETHYLPHENIDATE HCL ER 25 MG PO CP24: 25.0000 mg | ORAL_CAPSULE | Freq: Every day | ORAL | 0 refills | Status: DC

## 2024-07-10 MED ORDER — DEXMETHYLPHENIDATE HCL ER 25 MG PO CP24
25.0000 mg | ORAL_CAPSULE | Freq: Every day | ORAL | 0 refills | Status: AC
Start: 2024-09-24 — End: ?

## 2024-07-29 ENCOUNTER — Encounter (HOSPITAL_COMMUNITY): Payer: Self-pay

## 2024-07-31 NOTE — Telephone Encounter (Signed)
 Patients Prozac  was approved

## 2024-09-18 ENCOUNTER — Encounter: Payer: Self-pay | Admitting: Family Medicine

## 2024-09-18 ENCOUNTER — Ambulatory Visit: Payer: MEDICAID | Admitting: Family Medicine

## 2024-09-18 VITALS — BP 92/60 | HR 110 | Temp 98.4°F | Ht 69.0 in | Wt 134.7 lb

## 2024-09-18 DIAGNOSIS — Z13220 Encounter for screening for lipoid disorders: Secondary | ICD-10-CM

## 2024-09-18 DIAGNOSIS — D508 Other iron deficiency anemias: Secondary | ICD-10-CM

## 2024-09-18 DIAGNOSIS — Z0000 Encounter for general adult medical examination without abnormal findings: Secondary | ICD-10-CM

## 2024-09-18 DIAGNOSIS — E559 Vitamin D deficiency, unspecified: Secondary | ICD-10-CM

## 2024-09-18 LAB — CBC WITH DIFFERENTIAL/PLATELET
Basophils Absolute: 0 K/uL (ref 0.0–0.1)
Basophils Relative: 0.6 % (ref 0.0–3.0)
Eosinophils Absolute: 0.1 K/uL (ref 0.0–0.7)
Eosinophils Relative: 1.1 % (ref 0.0–5.0)
HCT: 43.7 % (ref 39.0–52.0)
Hemoglobin: 14.8 g/dL (ref 13.0–17.0)
Lymphocytes Relative: 32 % (ref 12.0–46.0)
Lymphs Abs: 1.6 K/uL (ref 0.7–4.0)
MCHC: 33.8 g/dL (ref 30.0–36.0)
MCV: 87.4 fl (ref 78.0–100.0)
Monocytes Absolute: 0.4 K/uL (ref 0.1–1.0)
Monocytes Relative: 7.9 % (ref 3.0–12.0)
Neutro Abs: 2.8 K/uL (ref 1.4–7.7)
Neutrophils Relative %: 58.4 % (ref 43.0–77.0)
Platelets: 331 K/uL (ref 150.0–400.0)
RBC: 5 Mil/uL (ref 4.22–5.81)
RDW: 13.2 % (ref 11.5–15.5)
WBC: 4.8 K/uL (ref 4.0–10.5)

## 2024-09-18 LAB — COMPREHENSIVE METABOLIC PANEL WITH GFR
ALT: 18 U/L (ref 0–53)
AST: 19 U/L (ref 0–37)
Albumin: 4.5 g/dL (ref 3.5–5.2)
Alkaline Phosphatase: 39 U/L (ref 39–117)
BUN: 7 mg/dL (ref 6–23)
CO2: 30 meq/L (ref 19–32)
Calcium: 9.6 mg/dL (ref 8.4–10.5)
Chloride: 102 meq/L (ref 96–112)
Creatinine, Ser: 0.67 mg/dL (ref 0.40–1.50)
GFR: 133.62 mL/min (ref >60.00)
Glucose, Bld: 87 mg/dL (ref 70–99)
Potassium: 4.1 meq/L (ref 3.5–5.1)
Sodium: 139 meq/L (ref 135–145)
Total Bilirubin: 0.3 mg/dL (ref 0.2–1.2)
Total Protein: 7 g/dL (ref 6.0–8.3)

## 2024-09-18 LAB — VITAMIN D 25 HYDROXY (VIT D DEFICIENCY, FRACTURES): VITD: 16.76 ng/mL — ABNORMAL LOW (ref 30.00–100.00)

## 2024-09-18 LAB — LIPID PANEL
Cholesterol: 171 mg/dL (ref 0–200)
HDL: 63 mg/dL (ref >39.00)
LDL Cholesterol: 93 mg/dL (ref 0–99)
NonHDL: 107.92
Total CHOL/HDL Ratio: 3
Triglycerides: 77 mg/dL (ref 0.0–149.0)
VLDL: 15.4 mg/dL (ref 0.0–40.0)

## 2024-09-18 NOTE — Progress Notes (Signed)
 Complete physical exam  Patient: Kurt Mclaughlin   DOB: 01/29/2001   23 y.o. Male  MRN: [id number]  Subjective:    Chief Complaint  Patient presents with   Annual Exam    Kurt Mclaughlin is a 23 y.o. male who presents today for a complete physical exam. He reports consuming a regular diet. Eats some cheese, some fruits, occasionally carrots, cereal, etc. Pt has a fairly restricted diet, is on vitamin D  and iron  supplements. The patient does not participate in regular exercise at present. He generally feels well. He reports sleeping somewhat poorly, sometimes will stay up all night watching movies but will try to fall asleep. He does not have additional problems to discuss today.    Most recent fall risk assessment:     No data to display           Most recent depression screenings:    09/18/2024    9:44 AM 03/17/2024    2:52 PM  PHQ 2/9 Scores  PHQ - 2 Score 0 0  PHQ- 9 Score 3 0      Data saved with a previous flowsheet row definition    Vision:Not within last year  and wears glasses, hasn't been in a couple of years, no current vision issues and Dental: No current dental problems and Receives regular dental care  Patient Active Problem List   Diagnosis Date Noted   Iron  deficiency anemia secondary to inadequate dietary iron  intake 03/17/2024   Vitamin D  deficiency 03/17/2024   Chronic tension headaches 09/25/2023   Generalized anxiety disorder 04/21/2014   ADHD (attention deficit hyperactivity disorder), combined type 12/25/2012   Autism spectrum disorder 12/25/2012      Patient Care Team: Ozell Heron HERO, MD as PCP - General (Family Medicine)   Outpatient Medications Prior to Visit  Medication Sig   amoxicillin-clavulanate (AUGMENTIN) 875-125 MG tablet Take 1 tablet by mouth 2 (two) times daily.   Dexmethylphenidate  HCl (FOCALIN  XR) 25 MG CP24 Take 1 capsule (25 mg total) by mouth daily.   [START ON 09/24/2024] Dexmethylphenidate  HCl (FOCALIN  XR) 25 MG  CP24 Take 1 capsule (25 mg total) by mouth daily.   Dexmethylphenidate  HCl 25 MG CP24 Take 1 capsule (25 mg total) by mouth daily.   divalproex  (DEPAKOTE  ER) 250 MG 24 hr tablet Take 1 tablet (250 mg total) by mouth daily.   ferrous sulfate  325 (65 FE) MG tablet TAKE 1 TABLET BY MOUTH EVERY DAY (Patient taking differently: Take 325 mg by mouth every other day.)   FLUoxetine  (PROZAC ) 20 MG tablet Take 1 tablet (20 mg total) by mouth daily.   guanFACINE  (INTUNIV ) 4 MG TB24 ER tablet Take 1 tablet (4 mg total) by mouth daily.   ofloxacin (FLOXIN) 0.3 % OTIC solution Place 10 drops into the right ear daily.   risperiDONE  (RISPERDAL ) 0.5 MG tablet Take 1 tablet (0.5 mg total) by mouth 2 (two) times daily.   [DISCONTINUED] hydrOXYzine  (ATARAX ) 10 MG tablet Take 1 tablet (10 mg total) by mouth 3 (three) times daily as needed.   [DISCONTINUED] Vitamin D , Ergocalciferol , (DRISDOL ) 1.25 MG (50000 UNIT) CAPS capsule Take 1 capsule (50,000 Units total) by mouth every 7 (seven) days.   No facility-administered medications prior to visit.    Review of Systems  HENT:  Negative for hearing loss.   Eyes:  Negative for blurred vision.  Respiratory:  Negative for shortness of breath.   Cardiovascular:  Negative for chest pain.  Gastrointestinal: Negative.   Genitourinary:  Negative.   Musculoskeletal:  Negative for back pain.  Neurological:  Negative for headaches.  Psychiatric/Behavioral:  Negative for depression.        Objective:     BP 92/60   Pulse (!) 110   Temp 98.4 F (36.9 C) (Oral)   Ht 5' 9 (1.753 m)   Wt 134 lb 11.2 oz (61.1 kg)   SpO2 100%   BMI 19.89 kg/m    Physical Exam Vitals reviewed.  Constitutional:      Appearance: Normal appearance. He is well-groomed and normal weight.  HENT:     Right Ear: Tympanic membrane and ear canal normal.     Left Ear: Tympanic membrane and ear canal normal.     Mouth/Throat:     Mouth: Mucous membranes are moist.     Pharynx: No  posterior oropharyngeal erythema.  Eyes:     Extraocular Movements: Extraocular movements intact.     Conjunctiva/sclera: Conjunctivae normal.  Neck:     Thyroid : No thyromegaly.  Cardiovascular:     Rate and Rhythm: Normal rate and regular rhythm.     Heart sounds: S1 normal and S2 normal. No murmur heard. Pulmonary:     Effort: Pulmonary effort is normal.     Breath sounds: Normal breath sounds and air entry. No rales.  Abdominal:     General: Abdomen is flat. Bowel sounds are normal.  Musculoskeletal:     Right lower leg: No edema.     Left lower leg: No edema.  Lymphadenopathy:     Cervical: No cervical adenopathy.  Neurological:     General: No focal deficit present.     Mental Status: He is alert and oriented to person, place, and time.     Gait: Gait is intact.  Psychiatric:        Mood and Affect: Mood and affect normal.      No results found for any visits on 09/18/24.     Assessment & Plan:    Routine Health Maintenance and Physical Exam  Immunization History  Administered Date(s) Administered   DTaP 07/22/2003, 10/15/2003, 12/17/2003, 11/14/2004, 07/11/2007   Dtap, Unspecified 07/22/2003, 10/15/2003, 12/17/2003, 11/14/2004   HIB, Unspecified 07/22/2003, 10/15/2003, 12/17/2003, 05/24/2004   HPV Quadrivalent 07/29/2014, 10/02/2014, 06/14/2015   Hep A, Unspecified 06/01/2006   Hep B, Unspecified 2003/07/11, 06/17/2003, 02/18/2004   Hepatitis A, Ped/Adol-2 Dose 06/01/2006, 07/11/2007   Hepatitis B, PED/ADOLESCENT 06-Oct-2003, 06/17/2003, 02/18/2004   IPV 07/22/2003, 10/15/2003, 12/17/2003, 07/11/2007   Influenza Nasal 07/18/2011, 08/18/2011   Influenza, Seasonal, Injecte, Preservative Fre 09/25/2023   Influenza,Quad,Nasal, Live 07/23/2013   Influenza,inj,Quad PF,6+ Mos 07/29/2014, 09/03/2015   Influenza-Unspecified 09/03/2015   MMR 05/24/2004, 07/11/2007   MenQuadfi_Meningococcal Groups ACYW Conjugate 08/01/2021   Meningococcal Conjugate 07/29/2014    Meningococcal Mcv4o 07/29/2014   PFIZER(Purple Top)SARS-COV-2 Vaccination 02/18/2020, 03/10/2020, 11/13/2020   Pneumococcal Conjugate PCV 7 07/22/2003, 10/15/2003, 11/15/2003, 12/17/2003, 11/14/2004   Polio, Unspecified 07/22/2003, 10/15/2003, 12/17/2003   Tdap 07/23/2013   Varicella 05/24/2004, 07/11/2007    Health Maintenance  Topic Date Due   Meningococcal B Vaccine (1 of 2 - Standard) Never done   DTaP/Tdap/Td (7 - Td or Tdap) 07/24/2023   Influenza Vaccine  06/06/2024   COVID-19 Vaccine (4 - 2025-26 season) 07/07/2024   Hepatitis C Screening  09/24/2024 (Originally 05/13/2021)   HIV Screening  09/24/2024 (Originally 05/13/2018)   HPV VACCINES  Completed   Pneumococcal Vaccine  Aged Out   Hepatitis B Vaccines 19-59 Average Risk  Discontinued  Discussed health benefits of physical activity, and encouraged him to engage in regular exercise appropriate for his age and condition.  Vitamin D  deficiency -     VITAMIN D  25 Hydroxy (Vit-D Deficiency, Fractures); Future  Iron  deficiency anemia secondary to inadequate dietary iron  intake -     Iron , TIBC and Ferritin Panel; Future -     CBC with Differential/Platelet  Lipid screening -     Lipid panel  Routine general medical examination at a health care facility -     Comprehensive metabolic panel with GFR  General physical exam findings are normal today. I reviewed the patient's preventative testing, immunizations, and lifestyle habits. I made appropriate recommendations and placed orders for the appropriate tests and/or vaccinations. I counseled the patient on the CDC's recommendations for healthy exercise and diet. I counseled the patient on healthy sleep habits and stress management. Handouts to reinforce the counseling were given at the conclusion of the visit.    Return in 1 year (on 09/18/2025) for annual physical exam.     Heron CHRISTELLA Sharper, MD

## 2024-09-18 NOTE — Patient Instructions (Addendum)
 Flu and Tetanus are both due  Health Maintenance, Male Adopting a healthy lifestyle and getting preventive care are important in promoting health and wellness. Ask your health care provider about: The right schedule for you to have regular tests and exams. Things you can do on your own to prevent diseases and keep yourself healthy. What should I know about diet, weight, and exercise? Eat a healthy diet  Eat a diet that includes plenty of vegetables, fruits, low-fat dairy products, and lean protein. Do not eat a lot of foods that are high in solid fats, added sugars, or sodium. Maintain a healthy weight Body mass index (BMI) is a measurement that can be used to identify possible weight problems. It estimates body fat based on height and weight. Your health care provider can help determine your BMI and help you achieve or maintain a healthy weight. Get regular exercise Get regular exercise. This is one of the most important things you can do for your health. Most adults should: Exercise for at least 150 minutes each week. The exercise should increase your heart rate and make you sweat (moderate-intensity exercise). Do strengthening exercises at least twice a week. This is in addition to the moderate-intensity exercise. Spend less time sitting. Even light physical activity can be beneficial. Watch cholesterol and blood lipids Have your blood tested for lipids and cholesterol at 21 years of age, then have this test every 5 years. You may need to have your cholesterol levels checked more often if: Your lipid or cholesterol levels are high. You are older than 21 years of age. You are at high risk for heart disease. What should I know about cancer screening? Many types of cancers can be detected early and may often be prevented. Depending on your health history and family history, you may need to have cancer screening at various ages. This may include screening for: Colorectal cancer. Prostate  cancer. Skin cancer. Lung cancer. What should I know about heart disease, diabetes, and high blood pressure? Blood pressure and heart disease High blood pressure causes heart disease and increases the risk of stroke. This is more likely to develop in people who have high blood pressure readings or are overweight. Talk with your health care provider about your target blood pressure readings. Have your blood pressure checked: Every 3-5 years if you are 16-58 years of age. Every year if you are 69 years old or older. If you are between the ages of 66 and 38 and are a current or former smoker, ask your health care provider if you should have a one-time screening for abdominal aortic aneurysm (AAA). Diabetes Have regular diabetes screenings. This checks your fasting blood sugar level. Have the screening done: Once every three years after age 54 if you are at a normal weight and have a low risk for diabetes. More often and at a younger age if you are overweight or have a high risk for diabetes. What should I know about preventing infection? Hepatitis B If you have a higher risk for hepatitis B, you should be screened for this virus. Talk with your health care provider to find out if you are at risk for hepatitis B infection. Hepatitis C Blood testing is recommended for: Everyone born from [birth year] through 1963. Anyone with known risk factors for hepatitis C. Sexually transmitted infections (STIs) You should be screened each year for STIs, including gonorrhea and chlamydia, if: You are sexually active and are younger than 21 years of age. You are older  than 21 years of age and your health care provider tells you that you are at risk for this type of infection. Your sexual activity has changed since you were last screened, and you are at increased risk for chlamydia or gonorrhea. Ask your health care provider if you are at risk. Ask your health care provider about whether you are at high risk for HIV.  Your health care provider may recommend a prescription medicine to help prevent HIV infection. If you choose to take medicine to prevent HIV, you should first get tested for HIV. You should then be tested every 3 months for as long as you are taking the medicine. Follow these instructions at home: Alcohol use Do not drink alcohol if your health care provider tells you not to drink. If you drink alcohol: Limit how much you have to 0-2 drinks a day. Know how much alcohol is in your drink. In the [place] one drink equals one 12 oz bottle of beer (355 mL), one 5 oz glass of wine (148 mL), or one 1 oz glass of hard liquor (44 mL). Lifestyle Do not use any products that contain nicotine or tobacco. These products include cigarettes, chewing tobacco, and vaping devices, such as e-cigarettes. If you need help quitting, ask your health care provider. Do not use street drugs. Do not share needles. Ask your health care provider for help if you need support or information about quitting drugs. General instructions Schedule regular health, dental, and eye exams. Stay current with your vaccines. Tell your health care provider if: You often feel depressed. You have ever been abused or do not feel safe at home. Summary Adopting a healthy lifestyle and getting preventive care are important in promoting health and wellness. Follow your health care provider's instructions about healthy diet, exercising, and getting tested or screened for diseases. Follow your health care provider's instructions on monitoring your cholesterol and blood pressure. This information is not intended to replace advice given to you by your health care provider. Make sure you discuss any questions you have with your health care provider. Document Revised: 03/14/2021 Document Reviewed: 03/14/2021 Elsevier Patient Education  2024 [employer].

## 2024-09-19 LAB — IRON,TIBC AND FERRITIN PANEL
%SAT: 31 % (ref 20–48)
Ferritin: 34 ng/mL — ABNORMAL LOW (ref 38–380)
Iron: 97 ug/dL (ref 50–195)
TIBC: 313 ug/dL (ref 250–425)

## 2024-09-20 ENCOUNTER — Other Ambulatory Visit: Payer: Self-pay | Admitting: Family Medicine

## 2024-09-20 DIAGNOSIS — D508 Other iron deficiency anemias: Secondary | ICD-10-CM

## 2024-09-22 ENCOUNTER — Ambulatory Visit: Payer: Self-pay | Admitting: Family Medicine

## 2024-09-22 DIAGNOSIS — E559 Vitamin D deficiency, unspecified: Secondary | ICD-10-CM

## 2024-09-22 MED ORDER — VITAMIN D (ERGOCALCIFEROL) 1.25 MG (50000 UNIT) PO CAPS: 50000.0000 [IU] | ORAL_CAPSULE | ORAL | 3 refills | Status: AC

## 2024-10-07 ENCOUNTER — Telehealth (HOSPITAL_COMMUNITY): Payer: MEDICAID | Admitting: Psychiatry

## 2024-10-13 NOTE — Progress Notes (Unsigned)
 BH MD/PA/NP OP Progress Note  10/13/2024 8:52 AM Kurt Mclaughlin  MRN:  [id number]  Visit Diagnosis:  No diagnosis found.   Assessment: Kurt Mclaughlin is a 23 y.o. male with a history of Autism and ADHD who presents virtually to [facility] for initial evaluation on 02/01/2023, following his transition from Dr. Philis after her retirement.  Patient has a long history of autism and ADHD which has been well managed for the past several years since connecting with Dr. Philis.  Prior to that patient had had an increase in mood lability, anxiety, and mood symptoms in ninth grade.  At initial visit patient reported some increased anxiety related to situational stressors.  Outside of this he notes that mood, anxiety, and attention symptoms have been well-controlled.    [name] presents for follow-up evaluation. Today, 10/13/24, patient    that mood symptoms have been stable and he denies any anxiety interim.  Continues take his medications consistently without any adverse side effects.  We will continue current regimen and follow-up in 3 months  Patient could benefit from getting a baseline EKG given he is taking stimulant medication and maternal grand father has history of potential MI.  Plan: - Continue Focalin  XR 25 mg QD - Continue Risperidone  0.5 mg BID - Continue Guanfacine  ER 4 mg QD - Continue Depakote  ER 250 mg QHS - Continue Prozac  20 mg QD - Continue Atarax  10 mg TID prn anxiety - CBC, CMP, Vitamin D , and folate. He is taking  - Will look into therapy options for ADHD and autism - Will submit records to college documenting his ADHD/autism diagnosis - Crisis resources reviewed - Follow up in 3 months   Chief Complaint:  No chief complaint on file.  HPI: Kurt Mclaughlin presents alongside his mother. He reports    that he is good and the last 3 months were uneventful.  He did go to [place] twice once for vacation and again for a funeral.   He also started school 3 weeks ago.  He is retaking a critical thinking class and took in the last couple semesters.  So far he believes that the classes are going well though he has not yet received any grades.  Patient has been a little bit less active outside of the house recently.  Notably his peers support specialist got another job and they are looking for a new person to take over.  His mother has made plans to try and engage in some of this however navigating work has been a hurdle.  He continues take his medication consistently denying any adverse side effects.  Will continue on his current regimen.  Patient has PCP appointment in November and encouraged him to get EKG at that time.  Past Psychiatric History: Had outpatient med management at [facility] to 01-2016, followed by Dr. Sable, and then with Dr. Philis. No prior psychiatric hospitalizations or prior suicide attempts.  Has been on Lexapro , Abilify , Dexedrine , Vyvanse , trazodone, propranolol, and clonidine  in the past. Currently on a regimen of Focalin  XR, guanfacine , Depakote , Risperdal , and Prozac .  Past Medical History:  Past Medical History:  Diagnosis Date   ADHD (attention deficit hyperactivity disorder)    Anxiety    Autism    Oppositional defiant disorder     Past Surgical History:  Procedure Laterality Date   CRANIOPLASTY Bilateral 02/05   TOOTH EXTRACTION  Age 16   Family History:  Family History  Problem Relation Age of Onset  ADD / ADHD Neg Hx    Alcohol abuse Neg Hx    Anxiety disorder Neg Hx    Bipolar disorder Neg Hx    Dementia Neg Hx    Depression Neg Hx    Drug abuse Neg Hx    OCD Neg Hx    Paranoid behavior Neg Hx    Physical abuse Neg Hx    Schizophrenia Neg Hx    Seizures Neg Hx    Sexual abuse Neg Hx     Social History:  Social History   Socioeconomic History   Marital status: Single    Spouse name: Not on file   Number of children: Not on file   Years of education: Not on file    Highest education level: 12th grade  Occupational History   Not on file  Tobacco Use   Smoking status: Never   Smokeless tobacco: Never  Vaping Use   Vaping status: Never Used  Substance and Sexual Activity   Alcohol use: No   Drug use: Never   Sexual activity: Never  Other Topics Concern   Not on file  Social History Narrative   Not on file   Social Drivers of Health   Financial Resource Strain: Low Risk  (09/24/2023)   Overall Financial Resource Strain (CARDIA)    Difficulty of Paying Living Expenses: Not hard at all  Food Insecurity: No Food Insecurity (09/24/2023)   Hunger Vital Sign    Worried About Running Out of Food in the Last Year: Never true    Ran Out of Food in the Last Year: Never true  Transportation Needs: No Transportation Needs (09/24/2023)   PRAPARE - Administrator, Civil Service (Medical): No    Lack of Transportation (Non-Medical): No  Physical Activity: Unknown (09/24/2023)   Exercise Vital Sign    Days of Exercise per Week: 0 days    Minutes of Exercise per Session: Not on file  Stress: Stress Concern Present (09/24/2023)   [employer] of Occupational Health - Occupational Stress Questionnaire    Feeling of Stress : To some extent  Social Connections: Socially Isolated (09/24/2023)   Social Connection and Isolation Panel    Frequency of Communication with Friends and Family: Never    Frequency of Social Gatherings with Friends and Family: Never    Attends Religious Services: Never    Database Administrator or Organizations: No    Attends Engineer, Structural: Not on file    Marital Status: Never married    Allergies: No Known Allergies  Current Medications: Current Outpatient Medications  Medication Sig Dispense Refill   Dexmethylphenidate  HCl (FOCALIN  XR) 25 MG CP24 Take 1 capsule (25 mg total) by mouth daily. 30 capsule 0   Dexmethylphenidate  HCl (FOCALIN  XR) 25 MG CP24 Take 1 capsule (25 mg total) by mouth  daily. 30 capsule 0   Dexmethylphenidate  HCl 25 MG CP24 Take 1 capsule (25 mg total) by mouth daily. 30 capsule 0   divalproex  (DEPAKOTE  ER) 250 MG 24 hr tablet Take 1 tablet (250 mg total) by mouth daily. 90 tablet 1   ferrous sulfate  325 (65 FE) MG tablet TAKE 1 TABLET BY MOUTH EVERY DAY 90 tablet 1   FLUoxetine  (PROZAC ) 20 MG tablet Take 1 tablet (20 mg total) by mouth daily. 90 tablet 0   guanFACINE  (INTUNIV ) 4 MG TB24 ER tablet Take 1 tablet (4 mg total) by mouth daily. 90 tablet 1   risperiDONE  (RISPERDAL ) 0.5 MG  tablet Take 1 tablet (0.5 mg total) by mouth 2 (two) times daily. 180 tablet 1   Vitamin D , Ergocalciferol , (DRISDOL ) 1.25 MG (50000 UNIT) CAPS capsule Take 1 capsule (50,000 Units total) by mouth every 7 (seven) days. 12 capsule 3   No current facility-administered medications for this visit.     Psychiatric Specialty Exam: Review of Systems  There were no vitals taken for this visit.There is no height or weight on file to calculate BMI.  General Appearance: Fairly Groomed  Eye Contact:  Minimal  Speech:  Clear and Coherent and Normal Rate  Volume:  Normal  Mood:  Euthymic  Affect:  Congruent  Thought Process:  Coherent  Orientation:  Full (Time, Place, and Person)  Thought Content: Logical and concrete   Suicidal Thoughts:  No  Homicidal Thoughts:  No  Memory:  Immediate;   Fair  Judgement:  Fair  Insight:  Fair  Psychomotor Activity:  Normal  Concentration:  Concentration: Fair  Recall:  [employer] of Knowledge: Fair  Language: Good  Akathisia:  No    AIMS (if indicated): not done  Assets:  Architect Leisure Time Physical Health  ADL's:  Intact  Cognition: WNL  Sleep:  Good   Metabolic Disorder Labs: Lab Results  Component Value Date   HGBA1C 5.2 12/25/2017   MPG 103 12/25/2017   MPG 120 (H) 04/29/2015   Lab Results  Component Value Date   PROLACTIN 15.3 (H) 12/25/2017   PROLACTIN <0.3 (L) 04/29/2015    Lab Results  Component Value Date   CHOL 171 09/18/2024   TRIG 77.0 09/18/2024   HDL 63.00 09/18/2024   CHOLHDL 3 09/18/2024   VLDL 15.4 09/18/2024   LDLCALC 93 09/18/2024   LDLCALC 90 12/25/2017   Lab Results  Component Value Date   TSH 0.72 09/25/2023    Therapeutic Level Labs: No results found for: LITHIUM No results found for: VALPROATE No results found for: CBMZ   Screenings: GAD-7    Flowsheet Row Office Visit from 09/18/2024 in [facility] [place] Office Visit from [facility]/20[facility] Office Visit from 12[facility]03/2017 in [facility] at [facility]  Total GAD-7 Score 7 4 9    PHQ2-9    Flowsheet Row Office Visit from 09/18/2024 in [facility] [place] Office Visit from 03/17/2024 in [facility] [place] Office Visit from 02/01/2023 in [facility] Video Visit from 01/12/2021 in [facility] at [facility] Office Visit from 12/03/2017 in [facility] at [facility]  PHQ-2 Total Score 0 0 1 3 1   PHQ-9 Total Score 3 0 -- 9 --   Flowsheet Row Office Visit from 02/01/2023 in [facility] Video Visit from 01/12/2021 in [facility] at [facility]  C-SSRS RISK CATEGORY No Risk No Risk    Collaboration of Care: Collaboration of Care: Medication Management AEB medication prescription, Primary Care Provider AEB chart review, and Other provider involved in patient's care AEB urgent care chart review  Patient/Guardian was advised Release of Information must be obtained prior to any record release in order to collaborate their care with an outside provider. Patient/Guardian was advised if they have not already done so to contact the  registration department to sign all necessary forms in order for us  to release information regarding their care.   Consent: Patient/Guardian gives  verbal consent for treatment and assignment of benefits for services provided during this visit. Patient/Guardian expressed understanding and agreed to proceed.    Arvella CHRISTELLA Finder, MD 10/13/2024, 8:52 AM   Virtual Visit via Video Note  I connected with Morene Rubenstein on 10/13/24 at  4:00 PM EST by a video enabled telemedicine application and verified that I am speaking with the correct person using two identifiers.  Location: Patient: Home Provider: Home Office   I discussed the limitations of evaluation and management by telemedicine and the availability of in person appointments. The patient expressed understanding and agreed to proceed.   I discussed the assessment and treatment plan with the patient. The patient was provided an opportunity to ask questions and all were answered. The patient agreed with the plan and demonstrated an understanding of the instructions.   The patient was advised to call back or seek an in-person evaluation if the symptoms worsen or if the condition fails to improve as anticipated.  I provided 15 minutes of non-face-to-face time during this encounter.   Arvella CHRISTELLA Finder, MD

## 2024-10-16 ENCOUNTER — Encounter (HOSPITAL_COMMUNITY): Payer: Self-pay | Admitting: Psychiatry

## 2024-10-16 ENCOUNTER — Telehealth (HOSPITAL_COMMUNITY): Payer: MEDICAID | Admitting: Psychiatry

## 2024-10-16 DIAGNOSIS — F902 Attention-deficit hyperactivity disorder, combined type: Secondary | ICD-10-CM

## 2024-10-16 DIAGNOSIS — F840 Autistic disorder: Secondary | ICD-10-CM

## 2024-10-16 MED ORDER — DEXMETHYLPHENIDATE HCL ER 25 MG PO CP24: 25.0000 mg | ORAL_CAPSULE | Freq: Every day | ORAL | 0 refills | Status: AC

## 2024-10-16 MED ORDER — GUANFACINE HCL ER 4 MG PO TB24: 4.0000 mg | ORAL_TABLET | Freq: Every day | ORAL | 1 refills | Status: AC

## 2024-10-16 MED ORDER — DIVALPROEX SODIUM ER 250 MG PO TB24: 250.0000 mg | ORAL_TABLET | Freq: Every day | ORAL | 1 refills | Status: AC

## 2024-10-16 MED ORDER — RISPERIDONE 0.5 MG PO TABS: 0.5000 mg | ORAL_TABLET | Freq: Two times a day (BID) | ORAL | 1 refills | Status: AC

## 2024-10-16 MED ORDER — FLUOXETINE HCL 20 MG PO TABS: 20.0000 mg | ORAL_TABLET | Freq: Every day | ORAL | 0 refills | Status: AC

## 2024-11-23 ENCOUNTER — Encounter (HOSPITAL_COMMUNITY): Payer: Self-pay

## 2024-11-28 ENCOUNTER — Telehealth (HOSPITAL_COMMUNITY): Payer: Self-pay | Admitting: *Deleted

## 2024-11-28 NOTE — Telephone Encounter (Signed)
 PA for Risperdal  0.5 mg tabs BID submitted via fax to [facility] and fax received from [facility] confirming PA has been APPROVED from 11/28/24 through 11/28/25. Mother has been advised.

## 2025-01-06 ENCOUNTER — Telehealth (HOSPITAL_COMMUNITY): Payer: MEDICAID | Admitting: Psychiatry

## 2025-01-08 ENCOUNTER — Telehealth (HOSPITAL_COMMUNITY): Payer: MEDICAID | Admitting: Psychiatry
